# Patient Record
Sex: Male | Born: 1995 | Race: White | Hispanic: No | Marital: Single | Smoking: Current every day smoker
Health system: Southern US, Community
[De-identification: ages and names within clinical notes are randomized; demographics above are authoritative.]

## PROBLEM LIST (undated history)

## (undated) DIAGNOSIS — F988 Other specified behavioral and emotional disorders with onset usually occurring in childhood and adolescence: Secondary | ICD-10-CM

## (undated) DIAGNOSIS — J302 Other seasonal allergic rhinitis: Secondary | ICD-10-CM

## (undated) HISTORY — PX: SKIN GRAFT: SHX250

## (undated) HISTORY — DX: Other seasonal allergic rhinitis: J30.2

## (undated) HISTORY — DX: Other specified behavioral and emotional disorders with onset usually occurring in childhood and adolescence: F98.8

---
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
## 2018-11-27 DIAGNOSIS — F988 Other specified behavioral and emotional disorders with onset usually occurring in childhood and adolescence: Secondary | ICD-10-CM | POA: Insufficient documentation

## 2018-11-27 DIAGNOSIS — B001 Herpesviral vesicular dermatitis: Secondary | ICD-10-CM | POA: Insufficient documentation

## 2018-11-28 ENCOUNTER — Other Ambulatory Visit (HOSPITAL_COMMUNITY): Payer: Self-pay | Admitting: Otolaryngology

## 2018-11-28 ENCOUNTER — Other Ambulatory Visit: Payer: Self-pay | Admitting: Otolaryngology

## 2018-11-28 DIAGNOSIS — R221 Localized swelling, mass and lump, neck: Secondary | ICD-10-CM

## 2018-12-07 ENCOUNTER — Other Ambulatory Visit: Payer: Self-pay

## 2018-12-07 ENCOUNTER — Ambulatory Visit
Admission: RE | Admit: 2018-12-07 | Discharge: 2018-12-07 | Disposition: A | Discharge status: Home / Self Care | Payer: BC Managed Care – PPO | Source: Ambulatory Visit | Attending: Otolaryngology | Admitting: Otolaryngology

## 2018-12-07 DIAGNOSIS — R221 Localized swelling, mass and lump, neck: Secondary | ICD-10-CM | POA: Insufficient documentation

## 2018-12-07 MED ORDER — IOHEXOL 300 MG/ML  SOLN
75.0000 mL | Freq: Once | INTRAMUSCULAR | Status: AC | PRN
  Administered 2018-12-07: 75 mL via INTRAVENOUS

## 2018-12-12 ENCOUNTER — Other Ambulatory Visit: Payer: Self-pay | Admitting: Otolaryngology

## 2018-12-12 DIAGNOSIS — R221 Localized swelling, mass and lump, neck: Secondary | ICD-10-CM

## 2018-12-18 ENCOUNTER — Other Ambulatory Visit: Payer: Self-pay | Admitting: Student

## 2018-12-19 ENCOUNTER — Other Ambulatory Visit: Payer: Self-pay

## 2018-12-19 ENCOUNTER — Ambulatory Visit
Admission: RE | Admit: 2018-12-19 | Discharge: 2018-12-19 | Disposition: A | Discharge status: Home / Self Care | Payer: BC Managed Care – PPO | Source: Ambulatory Visit | Attending: Otolaryngology | Admitting: Otolaryngology

## 2018-12-19 DIAGNOSIS — R221 Localized swelling, mass and lump, neck: Secondary | ICD-10-CM

## 2018-12-19 LAB — CBC
HCT: 47.9 % (ref 39.0–52.0)
Hemoglobin: 17 g/dL (ref 13.0–17.0)
MCH: 32.1 pg (ref 26.0–34.0)
MCHC: 35.5 g/dL (ref 30.0–36.0)
MCV: 90.4 fL (ref 80.0–100.0)
Platelets: 284 10*3/uL (ref 150–400)
RBC: 5.3 MIL/uL (ref 4.22–5.81)
RDW: 12.1 % (ref 11.5–15.5)
WBC: 6.2 10*3/uL (ref 4.0–10.5)
nRBC: 0 % (ref 0.0–0.2)

## 2018-12-19 LAB — PROTIME-INR
INR: 0.9 (ref 0.8–1.2)
Prothrombin Time: 12.3 seconds (ref 11.4–15.2)

## 2018-12-19 MED ORDER — SODIUM CHLORIDE 0.9 % IV SOLN: INTRAVENOUS | Status: DC

## 2018-12-19 NOTE — Discharge Instructions (Signed)
Needle Biopsy, Care After °These instructions tell you how to care for yourself after your procedure. Your doctor may also give you more specific instructions. Call your doctor if you have any problems or questions. °What can I expect after the procedure? °After the procedure, it is common to have: °· Soreness. °· Bruising. °· Mild pain. °Follow these instructions at home: ° °· Return to your normal activities as told by your doctor. Ask your doctor what activities are safe for you. °· Take over-the-counter and prescription medicines only as told by your doctor. °· Wash your hands with soap and water before you change your bandage (dressing). If you cannot use soap and water, use hand sanitizer. °· Follow instructions from your doctor about: °? How to take care of your puncture site. °? When and how to change your bandage. °? When to remove your bandage. °· Check your puncture site every day for signs of infection. Watch for: °? Redness, swelling, or pain. °? Fluid or blood.  °? Pus or a bad smell. °? Warmth. °· Do not take baths, swim, or use a hot tub until your doctor approves. Ask your doctor if you may take showers. You may only be allowed to take sponge baths. °· Keep all follow-up visits as told by your doctor. This is important. °Contact a doctor if you have: °· A fever. °· Redness, swelling, or pain at the puncture site, and it lasts longer than a few days. °· Fluid, blood, or pus coming from the puncture site. °· Warmth coming from the puncture site. °Get help right away if: °· You have a lot of bleeding from the puncture site. °Summary °· After the procedure, it is common to have soreness, bruising, or mild pain at the puncture site. °· Check your puncture site every day for signs of infection, such as redness, swelling, or pain. °· Get help right away if you have severe bleeding from your puncture site. °This information is not intended to replace advice given to you by your health care provider. Make  sure you discuss any questions you have with your health care provider. °Document Released: 01/29/2008 Document Revised: 02/28/2017 Document Reviewed: 02/28/2017 °Elsevier Patient Education © 2020 Elsevier Inc. ° °

## 2018-12-19 NOTE — Progress Notes (Signed)
Patient post LN biopsy (cervical), tolerated well, no sedation per DR Register. bandand dry/intact to left neck. Taking po;s without difficulty. Denies complaints at this time. Discharge instructions given with questions answered.

## 2018-12-19 NOTE — Progress Notes (Signed)
Pt stable after bx.Vss.Neck stable.D/c instructions given.F/u with his MD.

## 2018-12-21 ENCOUNTER — Encounter: Payer: Self-pay | Admitting: Diagnostic Radiology

## 2018-12-25 ENCOUNTER — Other Ambulatory Visit: Payer: Self-pay | Admitting: Anatomic Pathology & Clinical Pathology

## 2018-12-25 LAB — SURGICAL PATHOLOGY

## 2018-12-28 ENCOUNTER — Other Ambulatory Visit: Payer: BC Managed Care – PPO

## 2018-12-28 NOTE — Progress Notes (Signed)
Tumor Board Documentation  Gabryel Goetzinger was presented by Dr Grayland Ormond at our Tumor Board on 12/28/2018, which included representatives from medical oncology, radiation oncology, pathology, radiology, navigation, internal medicine, pharmacy, genetics, palliative care, research.  Raunak currently presents as a new patient, for new positive pathology, for [place] with history of the following treatments: surgical intervention(s), active survellience.  Additionally, we reviewed previous medical and familial history, history of present illness, and recent lab results along with all available histopathologic and imaging studies. The tumor board considered available treatment options and made the following recommendations: Additional screening PET, Bine Marrow biopsy, Seeing Medical Oncology tomorrow  The following procedures/referrals were also placed: No orders of the defined types were placed in this encounter.   Clinical Trial Status: not discussed   Staging used: To be determined  AJCC Staging:       Group: Hodgkins Lymphoma   National site-specific guidelines   were discussed with respect to the case.  Tumor board is a meeting of clinicians from various specialty areas who evaluate and discuss patients for whom a multidisciplinary approach is being considered. Final determinations in the plan of care are those of the provider(s). The responsibility for follow up of recommendations given during tumor board is that of the provider.   Today's extended care, comprehensive team conference, [place] was not present for the discussion and was not examined.   Multidisciplinary Tumor Board is a multidisciplinary case peer review process.  Decisions discussed in the Multidisciplinary Tumor Board reflect the opinions of the specialists present at the conference without having examined the patient.  Ultimately, treatment and diagnostic decisions rest with the primary provider(s) and the patient.

## 2018-12-29 ENCOUNTER — Inpatient Hospital Stay: Payer: BC Managed Care – PPO | Attending: Oncology | Admitting: Oncology

## 2018-12-29 ENCOUNTER — Encounter: Payer: Self-pay | Admitting: Oncology

## 2018-12-29 ENCOUNTER — Other Ambulatory Visit: Payer: Self-pay

## 2018-12-29 DIAGNOSIS — R221 Localized swelling, mass and lump, neck: Secondary | ICD-10-CM

## 2018-12-29 DIAGNOSIS — C8191 Hodgkin lymphoma, unspecified, lymph nodes of head, face, and neck: Secondary | ICD-10-CM | POA: Diagnosis not present

## 2018-12-29 NOTE — Progress Notes (Signed)
Patient prescreened for appointment. Patient has no concerns or questions.  

## 2018-12-31 NOTE — Progress Notes (Signed)
[facility]  [phone] Fax:(336) [phone]  ID: Dale Hansen OB: 1995-04-28  MR#: [id number]  OPF#:[id number]  Patient Care Team: Patient, No Pcp Per as PCP - General (General Practice)  I connected with Dale Hansen on 12/31/18 at  3:00 PM EDT by video enabled telemedicine visit and verified that I am speaking with the correct person using two identifiers.   I discussed the limitations, risks, security and privacy concerns of performing an evaluation and management service by telemedicine and the availability of in-person appointments. I also discussed with the patient that there may be a patient responsible charge related to this service. The patient expressed understanding and agreed to proceed.   Other persons participating in the visit and their role in the encounter: Patient, patient's mother and father, MD  Patients location: Home Providers location: Clinic  CHIEF COMPLAINT: Hodgkin's lymphoma  INTERVAL HISTORY: Patient agreed to video enabled telemedicine visit for initial evaluation since he is currently under quarantine for having direct contact with another student who was Covid positive.  He himself has not been tested, but has been asymptomatic.  He is a 23 year old male who noted a painless swelling in his neck.  Subsequent imaging and biopsy revealed Hodgkin's lymphoma.  He otherwise feels well.  He has no neurologic complaints.  He denies any recent fevers or illnesses.  He denies night sweats or unintentional weight loss.  He has no chest pain, shortness of breath, cough, or hemoptysis.  He denies any nausea, vomiting, constipation, or diarrhea.  He has no urinary complaints.  Patient feels at his baseline offers no specific complaints today.  REVIEW OF SYSTEMS:   Review of Systems  Constitutional: Negative.  Negative for fever, malaise/fatigue and weight loss.  Respiratory: Negative.  Negative for cough, hemoptysis and shortness of breath.     Cardiovascular: Negative.  Negative for chest pain and leg swelling.  Gastrointestinal: Negative.  Negative for abdominal pain.  Genitourinary: Negative.   Musculoskeletal: Negative.  Negative for neck pain.  Skin: Negative.  Negative for rash.  Neurological: Negative.  Negative for dizziness, focal weakness, weakness and headaches.  Psychiatric/Behavioral: Negative.  The patient is not nervous/anxious and does not have insomnia.     As per HPI. Otherwise, a complete review of systems is negative.  PAST MEDICAL HISTORY: Past Medical History:  Diagnosis Date   Attention deficit disorder (ADD)    Seasonal allergies     PAST SURGICAL HISTORY: Negative.  FAMILY HISTORY: Family History  Problem Relation Age of Onset   Cervical cancer Maternal Grandmother    Non-Hodgkin's lymphoma Paternal Uncle     ADVANCED DIRECTIVES (Y/N):  N  HEALTH MAINTENANCE: Social History   Tobacco Use   Smoking status: Not on file  Substance Use Topics   Alcohol use: Yes    Comment: socially   Drug use: Not on file     Colonoscopy:  PAP:  Bone density:  Lipid panel:  No Known Allergies  Current Outpatient Medications  Medication Sig Dispense Refill   amphetamine-dextroamphetamine (ADDERALL) 20 MG tablet Take by mouth.     VYVANSE 60 MG capsule TK 1 C PO QD     No current facility-administered medications for this visit.     OBJECTIVE: There were no vitals filed for this visit.   There is no height or weight on file to calculate BMI.    ECOG FS:0 - Asymptomatic  General: Well-developed, well-nourished, no acute distress. HEENT: Normocephalic. Neuro: Alert, answering all questions appropriately. Cranial  nerves grossly intact. Psych: Normal affect.  LAB RESULTS:  No results found for: NA, K, CL, CO2, GLUCOSE, BUN, CREATININE, CALCIUM, PROT, ALBUMIN, AST, ALT, ALKPHOS, BILITOT, GFRNONAA, GFRAA  Lab Results  Component Value Date   WBC 6.2 12/19/2018   HGB 17.0 12/19/2018    HCT 47.9 12/19/2018   MCV 90.4 12/19/2018   PLT 284 12/19/2018     STUDIES: Ct Soft Tissue Neck W Contrast  Result Date: 12/08/2018 CLINICAL DATA:  Mass of left side of neck. Additional history provided: Patient noticed a left-sided lump at the end of July and states it has grown in size, nonpainful, no pain with swallowing or eating. EXAM: CT NECK WITH CONTRAST TECHNIQUE: Multidetector CT imaging of the neck was performed using the standard protocol following the bolus administration of intravenous contrast. CONTRAST:  29m OMNIPAQUE IOHEXOL 300 MG/ML  SOLN COMPARISON:  No pertinent prior studies available for comparison. FINDINGS: Pharynx and larynx: No appreciable mass or swelling. Salivary glands: No evidence of inflammation, mass or stone. Thyroid: Unremarkable Lymph nodes: There are multiple enlarged cervical chain lymph nodes. An index left level II node/nodal mass measures 6.2 x 3.4 cm in transaxial dimensions (series 6, image 82). Enlarged left level V lymph node measuring up to 1.4 cm in short axis (series 6, image 84). Enlarged left lower III/IV lymph nodes measuring up to 1.7 cm in short axis (series 4, image 80) (series 4, image 77). There are additional left level III lymph nodes which are prominent, but not enlarged by short axis criteria. Mildly enlarged right level IB lymph node measuring 1.1 cm in short axis (series 2, image 55). Mildly enlarged right level II lymph node measuring 1.4 cm in short axis (series 2, image 45). Vascular: The major vascular structures of the neck appear patent. Limited intracranial: Unremarkable Visualized orbits: Unremarkable Mastoids and visualized paranasal sinuses: Mild scattered paranasal sinus mucosal thickening. No significant mastoid effusion. Skeleton: No acute bony abnormality or suspicious osseous lesion. Upper chest: The imaged lung apices are clear These results will be called to the ordering clinician or representative by the Radiologist  Assistant, and communication documented in the PACS or zVision Dashboard. IMPRESSION: Bilateral cervical lymphadenopathy as described. An index left level II node/nodal mass measures 6.2 x 3.4 cm. Findings are suspicious for lymphoma or other lymphoproliferative process. Metastatic nodal disease is also a differential consideration. Consider direct tissue sampling for further evaluation. Electronically Signed   By: KKellie Simmering  On: 12/08/2018 10:43   [place] Biopsy (lymph Nodes)  Result Date: 12/19/2018 INDICATION: Left neck mass. EXAM: ULTRASOUND GUIDED CORE BIOPSY OF LEFT NECK MASS/LYMPH NODE/ MEDICATIONS: None. ANESTHESIA/SEDATION: Local 1% lidocaine. PROCEDURE: The procedure, risks, benefits, and alternatives were explained to the patient. Questions regarding the procedure were encouraged and answered. The patient understands and consents to the procedure. The left neck was prepped with chlorhexidine in a sterile fashion, and a sterile drape was applied covering the operative field. A sterile gown and sterile gloves were used for the procedure. Local anesthesia was provided with 1% Lidocaine. Multiple 18 gauge core samples obtained from the dominant left neck mass. There were no complication. Preliminary cytopathological results indicate good biopsy sample. COMPLICATIONS: None. FINDINGS: Successful ultrasound-directed core biopsy of left neck mass/lymph node. No complications. Discharge instructions given to the patient. Follow-up with patient's physician. IMPRESSION: Successful ultrasound-directed core biopsy of left neck mass/lymph node. Electronically Signed   By: TMarcello Moores Register   On: 12/19/2018 09:57    ASSESSMENT: Hodgkin's lymphoma.  PLAN:    1.  Hodgkin's lymphoma: CT scan on December 08, 2018 reviewed independently and reported as above with 6.2 cm nodal mass in the left neck.  Subsequent biopsy confirmed the diagnosis.  Patient will be finishing school at [employer] and heading home  to [place] on January 22, 2019.  He will be at home through at least early January.  Plan is to do much of the work-up and staging as possible in [place] including PET scan, bone marrow biopsy, MUGA scan, and PFTs.  In the meantime, patient's parents will get him an appointment at [facility] to initiate treatment with ABVD while at home.  Patient will likely have to return to school during his treatments.  At this point, we can continue his regimen in [place] in January 2021 without any delay or missed treatments.  Will have a video assisted telemedicine visit just prior to his departure to home to ensure that all follow-up is established.  I provided 60 minutes of face-to-face video visit time during this encounter, and > 50% was spent counseling as documented under my assessment & plan.   Patient expressed understanding and was in agreement with this plan. He also understands that He can call clinic at any time with any questions, concerns, or complaints.   Cancer Staging No matching staging information was found for the patient.  Lloyd Huger, MD   12/31/2018 7:25 AM

## 2019-01-01 ENCOUNTER — Telehealth: Payer: Self-pay | Admitting: Emergency Medicine

## 2019-01-01 ENCOUNTER — Other Ambulatory Visit: Payer: Self-pay

## 2019-01-01 ENCOUNTER — Telehealth: Payer: Self-pay | Admitting: Oncology

## 2019-01-01 NOTE — Telephone Encounter (Signed)
Called to give pt appt details for this week. Will attempt later.

## 2019-01-01 NOTE — Telephone Encounter (Signed)
Spoke with patient to tell him that he would need to be at PAT COVID testing site between 12:30 and 2:30 on 11/4 and also to let him know that his BM BX has been scheduled for 11/10 and he is to arrive at 7:30. Pt verbalized his understanding, no questions or concerns at this time.

## 2019-01-01 NOTE — Telephone Encounter (Signed)
Called pt to let him know to go for COVID test on 11/4 between 12:30 and 2:30. No answer and voicemail full. Will attempt to call patient again later.

## 2019-01-03 ENCOUNTER — Other Ambulatory Visit
Admission: RE | Admit: 2019-01-03 | Discharge: 2019-01-03 | Disposition: A | Discharge status: Home / Self Care | Payer: BC Managed Care – PPO | Source: Ambulatory Visit | Attending: Oncology | Admitting: Oncology

## 2019-01-03 NOTE — Progress Notes (Signed)
Patient did not show up [facility]. Tried to call the patient and was unable to leave a message (mailbox full).

## 2019-01-04 ENCOUNTER — Ambulatory Visit: Payer: BC Managed Care – PPO

## 2019-01-08 ENCOUNTER — Other Ambulatory Visit: Payer: Self-pay | Admitting: Student

## 2019-01-08 ENCOUNTER — Other Ambulatory Visit: Payer: Self-pay

## 2019-01-08 ENCOUNTER — Ambulatory Visit
Admission: RE | Admit: 2019-01-08 | Discharge: 2019-01-08 | Disposition: A | Discharge status: Home / Self Care | Payer: BC Managed Care – PPO | Source: Ambulatory Visit | Attending: Oncology | Admitting: Oncology

## 2019-01-08 ENCOUNTER — Other Ambulatory Visit
Admission: RE | Admit: 2019-01-08 | Discharge: 2019-01-08 | Disposition: A | Discharge status: Home / Self Care | Payer: BC Managed Care – PPO | Source: Ambulatory Visit | Attending: Oncology | Admitting: Oncology

## 2019-01-08 ENCOUNTER — Other Ambulatory Visit: Payer: Self-pay | Admitting: Radiology

## 2019-01-08 DIAGNOSIS — R221 Localized swelling, mass and lump, neck: Secondary | ICD-10-CM | POA: Insufficient documentation

## 2019-01-08 DIAGNOSIS — Z01812 Encounter for preprocedural laboratory examination: Secondary | ICD-10-CM | POA: Diagnosis present

## 2019-01-08 DIAGNOSIS — U071 COVID-19: Secondary | ICD-10-CM | POA: Insufficient documentation

## 2019-01-08 LAB — GLUCOSE, CAPILLARY: Glucose-Capillary: 85 mg/dL (ref 70–99)

## 2019-01-08 MED ORDER — FLUDEOXYGLUCOSE F - 18 (FDG) INJECTION
10.0000 | Freq: Once | INTRAVENOUS | Status: AC | PRN
  Administered 2019-01-08: 10 via INTRAVENOUS

## 2019-01-09 ENCOUNTER — Ambulatory Visit
Admission: RE | Admit: 2019-01-09 | Discharge: 2019-01-09 | Disposition: A | Discharge status: Home / Self Care | Payer: BC Managed Care – PPO | Source: Ambulatory Visit | Attending: Oncology | Admitting: Oncology

## 2019-01-09 ENCOUNTER — Telehealth: Payer: Self-pay | Admitting: *Deleted

## 2019-01-09 LAB — SARS CORONAVIRUS 2 (TAT 6-24 HRS): SARS Coronavirus 2: POSITIVE — AB

## 2019-01-09 NOTE — OR Nursing (Signed)
Pt tested positive for Covid yesterday 01/08/2019. This was his first Covid positive test. He reports he was exposed three weeks ago. He has been asymptomatic. He has not been tested prior to yesterday. Per hospital policy pt procedure can be scheduled 11 days after positive Covid test.

## 2019-01-13 DIAGNOSIS — C8191 Hodgkin lymphoma, unspecified, lymph nodes of head, face, and neck: Secondary | ICD-10-CM | POA: Insufficient documentation

## 2019-01-13 DIAGNOSIS — C8190 Hodgkin lymphoma, unspecified, unspecified site: Secondary | ICD-10-CM | POA: Insufficient documentation

## 2019-01-13 NOTE — Progress Notes (Signed)
[facility]  Telephone:(336) [phone] Fax:(336) [phone]  ID: Dale Hansen OB: 1995/04/23  MR#: [id number]  AYT#:[id number]  Patient Care Team: Patient, No Pcp Per as PCP - General (General Practice)  I connected with Dale Hansen on 01/19/19 at  3:00 PM EST by video enabled telemedicine visit and verified that I am speaking with the correct person using two identifiers.   I discussed the limitations, risks, security and privacy concerns of performing an evaluation and management service by telemedicine and the availability of in-person appointments. I also discussed with the patient that there may be a patient responsible charge related to this service. The patient expressed understanding and agreed to proceed.   Other persons participating in the visit and their role in the encounter: Patient, patient's parents, MD.  Patients location: Home Providers location: Clinic  CHIEF COMPLAINT: Stage IIa Hodgkin's lymphoma  INTERVAL HISTORY: Patient is still under quarantine for recently testing positive for Covid, therefore agreed to video enabled telemedicine visit.  He continues to feel well and remains asymptomatic.  Pain the swelling in his neck is unchanged. He has no neurologic complaints.  He denies any recent fevers or illnesses.  He denies night sweats or unintentional weight loss.  He has no chest pain, shortness of breath, cough, or hemoptysis.  He denies any nausea, vomiting, constipation, or diarrhea.  He has no urinary complaints.  Patient offers no specific complaints today.  REVIEW OF SYSTEMS:   Review of Systems  Constitutional: Negative.  Negative for fever, malaise/fatigue and weight loss.  Respiratory: Negative.  Negative for cough, hemoptysis and shortness of breath.   Cardiovascular: Negative.  Negative for chest pain and leg swelling.  Gastrointestinal: Negative.  Negative for abdominal pain.  Genitourinary: Negative.   Musculoskeletal: Negative.   Negative for neck pain.  Skin: Negative.  Negative for rash.  Neurological: Negative.  Negative for dizziness, focal weakness, weakness and headaches.  Psychiatric/Behavioral: Negative.  The patient is not nervous/anxious and does not have insomnia.     As per HPI. Otherwise, a complete review of systems is negative.  PAST MEDICAL HISTORY: Past Medical History:  Diagnosis Date   Attention deficit disorder (ADD)    Seasonal allergies     PAST SURGICAL HISTORY: Negative.  FAMILY HISTORY: Family History  Problem Relation Age of Onset   Cervical cancer Maternal Grandmother    Non-Hodgkin's lymphoma Paternal Uncle     ADVANCED DIRECTIVES (Y/N):  N  HEALTH MAINTENANCE: Social History   Tobacco Use   Smoking status: Current Every Day Smoker    Packs/day: 0.25   Smokeless tobacco: Never Used  Substance Use Topics   Alcohol use: Yes    Comment: socially   Drug use: Never     Colonoscopy:  PAP:  Bone density:  Lipid panel:  No Known Allergies  Current Outpatient Medications  Medication Sig Dispense Refill   amphetamine-dextroamphetamine (ADDERALL) 20 MG tablet Take by mouth.     VYVANSE 60 MG capsule TK 1 C PO QD     No current facility-administered medications for this visit.    Facility-Administered Medications Ordered in Other Visits  Medication Dose Route Frequency Provider Last Rate Last Dose   0.9 %  sodium chloride infusion   Intravenous Continuous Monia Sabal, PA-C        OBJECTIVE: There were no vitals filed for this visit.   There is no height or weight on file to calculate BMI.    ECOG FS:0 - Asymptomatic  General: Well-developed, well-nourished,  no acute distress. HEENT: Normocephalic. Neuro: Alert, answering all questions appropriately. Cranial nerves grossly intact. Psych: Normal affect.  LAB RESULTS:  No results found for: NA, K, CL, CO2, GLUCOSE, BUN, CREATININE, CALCIUM, PROT, ALBUMIN, AST, ALT, ALKPHOS, BILITOT, GFRNONAA,  GFRAA  Lab Results  Component Value Date   WBC 5.5 01/19/2019   NEUTROABS 2.5 01/19/2019   HGB 16.2 01/19/2019   HCT 44.8 01/19/2019   MCV 90.0 01/19/2019   PLT 275 01/19/2019     STUDIES: Nm Pet Image Initial (pi) Skull Base To Thigh  Result Date: 01/08/2019 CLINICAL DATA:  Initial treatment strategy for Hodgkin's lymphoma. EXAM: NUCLEAR MEDICINE PET SKULL BASE TO THIGH TECHNIQUE: 10.0 mCi F-18 FDG was injected intravenously. Full-ring PET imaging was performed from the skull base to thigh after the radiotracer. CT data was obtained and used for attenuation correction and anatomic localization. Fasting blood glucose: 85 mg/dl COMPARISON:  CT neck 12/07/2018 FINDINGS: Mediastinal blood pool activity: SUV max 2.2 Liver activity: SUV max 3.0 NECK: Hypermetabolic activity the palatine tonsils, maximum SUV 9.2 on the left 11.9 on the right. Hypermetabolic bilateral level II adenopathy with left level III and level IV adenopathy noted. Index level IIa lymph node measuring 3.4 cm in short axis on image 61/3, maximum SUV 24.3. Left level IIb lymph node 1.1 cm in short axis on image 58/3, maximum SUV 10.9. Right level IIa lymph node 1.3 cm in short axis on image 57/3, maximum SUV 6.7. Left level IV lymph node 1.1 cm in short axis on image 83/3, maximum SUV 9.9. All of the lymph nodes in the neck are Deauville 5. Incidental CT findings: none CHEST: Accentuated distal esophageal activity, maximum SUV 4.6, probably physiologic but technically nonspecific. Incidental CT findings: 3 by 4 mm right upper lobe nodule on image 113/3, stable, highly likely to be benign and incidental based on patient age. ABDOMEN/PELVIS: Accentuated activity in a loop of right lower quadrant bowel is most likely physiologic, maximum SUV 9.1, no compelling CT abnormality. No splenomegaly or abnormal accentuated splenic activity. Right external iliac node 0.9 cm in short axis on image 288/3, maximum SUV 2.1, Deauville 2. Incidental CT  findings: none SKELETON: No significant abnormal hypermetabolic activity in this region. Incidental CT findings: none IMPRESSION: 1. Bilateral Deauville 5 adenopathy in the neck. Accentuated metabolic activity in both palatine tonsils. 2. Mildly accentuated distal esophageal activity is probably physiologic. 3. Low-grade activity in upper normal sized right external iliac lymph node, Deauville 2. Electronically Signed   By: Van Clines M.D.   On: 01/08/2019 14:57    ASSESSMENT: Stage IIa Hodgkin's lymphoma.  PLAN:    1.  Hodgkin's lymphoma: CT scan on December 08, 2018 reviewed independently and reported as above with 6.2 cm nodal mass in the left neck.  Subsequent biopsy confirmed the diagnosis.  PET scan results from January 08, 2019 reviewed independently and reported as above with bilateral neck lymphadenopathy and no other obvious evidence of disease confirming stage.  Patient has a bone marrow biopsy scheduled for Friday, January 19, 2019 to complete the staging work-up.  He is moving back to [place] next Tuesday and already has scheduled follow-up at [facility].  He will require to complete his MUGA as well as PFTs in [place].  He will also require port placement prior to initiating treatment.  He states he is returning to school in mid January, therefore will likely receive 2 or 3 treatments of ABVD prior to returning to [place].  Plan is to continue  treatments when he returns without significant delay.  Patient will need a PET scan after 2 cycles of or 4 treatments of ABVD for reevaluation.  No follow-up has been scheduled at this time.  Patient will return to clinic to continue treatment when he is back in [place].   2.  Covid positive: Patient has now been under quarantine for 10 days and remains asymptomatic.  I provided 25 minutes of face-to-face video visit time during this encounter, and > 50% was spent counseling as documented under my assessment & plan.    Patient  expressed understanding and was in agreement with this plan. He also understands that He can call clinic at any time with any questions, concerns, or complaints.   Cancer Staging Hodgkin lymphoma [facility]) Staging form: Hodgkin and Non-Hodgkin Lymphoma, AJCC 8th Edition - Clinical stage from 01/19/2019: Stage II (Hodgkin lymphoma, A - Asymptomatic) - Signed by Lloyd Huger, MD on 01/19/2019   Lloyd Huger, MD   01/19/2019 9:37 AM

## 2019-01-17 ENCOUNTER — Other Ambulatory Visit: Payer: Self-pay | Admitting: Radiology

## 2019-01-18 ENCOUNTER — Inpatient Hospital Stay: Payer: BC Managed Care – PPO | Attending: Oncology | Admitting: Oncology

## 2019-01-18 ENCOUNTER — Encounter: Payer: Self-pay | Admitting: Oncology

## 2019-01-18 DIAGNOSIS — C8191 Hodgkin lymphoma, unspecified, lymph nodes of head, face, and neck: Secondary | ICD-10-CM | POA: Diagnosis not present

## 2019-01-18 DIAGNOSIS — C8198 Hodgkin lymphoma, unspecified, lymph nodes of multiple sites: Secondary | ICD-10-CM

## 2019-01-18 NOTE — Progress Notes (Signed)
Patient prescreened for appointment. Patient has no concerns or questions.  

## 2019-01-19 ENCOUNTER — Ambulatory Visit
Admission: RE | Admit: 2019-01-19 | Discharge: 2019-01-19 | Disposition: A | Discharge status: Home / Self Care | Payer: BC Managed Care – PPO | Source: Ambulatory Visit | Attending: Oncology | Admitting: Oncology

## 2019-01-19 ENCOUNTER — Other Ambulatory Visit: Payer: Self-pay

## 2019-01-19 DIAGNOSIS — F17210 Nicotine dependence, cigarettes, uncomplicated: Secondary | ICD-10-CM | POA: Insufficient documentation

## 2019-01-19 DIAGNOSIS — Z79899 Other long term (current) drug therapy: Secondary | ICD-10-CM | POA: Diagnosis not present

## 2019-01-19 DIAGNOSIS — C8191 Hodgkin lymphoma, unspecified, lymph nodes of head, face, and neck: Secondary | ICD-10-CM | POA: Diagnosis not present

## 2019-01-19 DIAGNOSIS — R221 Localized swelling, mass and lump, neck: Secondary | ICD-10-CM

## 2019-01-19 LAB — CBC WITH DIFFERENTIAL/PLATELET
Abs Immature Granulocytes: 0.03 10*3/uL (ref 0.00–0.07)
Basophils Absolute: 0 10*3/uL (ref 0.0–0.1)
Basophils Relative: 1 %
Eosinophils Absolute: 0.3 10*3/uL (ref 0.0–0.5)
Eosinophils Relative: 5 %
HCT: 44.8 % (ref 39.0–52.0)
Hemoglobin: 16.2 g/dL (ref 13.0–17.0)
Immature Granulocytes: 1 %
Lymphocytes Relative: 33 %
Lymphs Abs: 1.8 10*3/uL (ref 0.7–4.0)
MCH: 32.5 pg (ref 26.0–34.0)
MCHC: 36.2 g/dL — ABNORMAL HIGH (ref 30.0–36.0)
MCV: 90 fL (ref 80.0–100.0)
Monocytes Absolute: 0.9 10*3/uL (ref 0.1–1.0)
Monocytes Relative: 16 %
Neutro Abs: 2.5 10*3/uL (ref 1.7–7.7)
Neutrophils Relative %: 44 %
Platelets: 275 10*3/uL (ref 150–400)
RBC: 4.98 MIL/uL (ref 4.22–5.81)
RDW: 12 % (ref 11.5–15.5)
WBC: 5.5 10*3/uL (ref 4.0–10.5)
nRBC: 0 % (ref 0.0–0.2)

## 2019-01-19 LAB — PROTIME-INR
INR: 1 (ref 0.8–1.2)
Prothrombin Time: 13.3 seconds (ref 11.4–15.2)

## 2019-01-19 MED ORDER — FENTANYL CITRATE (PF) 100 MCG/2ML IJ SOLN
INTRAMUSCULAR | Status: AC
  Filled 2019-01-19: qty 2

## 2019-01-19 MED ORDER — MIDAZOLAM HCL 2 MG/2ML IJ SOLN
INTRAMUSCULAR | Status: AC
  Filled 2019-01-19: qty 4

## 2019-01-19 MED ORDER — FENTANYL CITRATE (PF) 100 MCG/2ML IJ SOLN
INTRAMUSCULAR | Status: AC | PRN
  Administered 2019-01-19 (×2): 50 ug via INTRAVENOUS

## 2019-01-19 MED ORDER — MIDAZOLAM HCL 2 MG/2ML IJ SOLN
INTRAMUSCULAR | Status: AC | PRN
  Administered 2019-01-19 (×3): 1 mg via INTRAVENOUS

## 2019-01-19 MED ORDER — HEPARIN SOD (PORK) LOCK FLUSH 100 UNIT/ML IV SOLN
INTRAVENOUS | Status: AC
  Filled 2019-01-19: qty 5

## 2019-01-19 MED ORDER — SODIUM CHLORIDE 0.9 % IV SOLN
INTRAVENOUS | Status: DC
  Administered 2019-01-19: 10:00:00 via INTRAVENOUS

## 2019-01-19 NOTE — Discharge Instructions (Signed)
Moderate Conscious Sedation, Adult, Care After °These instructions provide you with information about caring for yourself after your procedure. Your health care provider may also give you more specific instructions. Your treatment has been planned according to current medical practices, but problems sometimes occur. Call your health care provider if you have any problems or questions after your procedure. °What can I expect after the procedure? °After your procedure, it is common: °· To feel sleepy for several hours. °· To feel clumsy and have poor balance for several hours. °· To have poor judgment for several hours. °· To vomit if you eat too soon. °Follow these instructions at home: °For at least 24 hours after the procedure: ° °· Do not: °? Participate in activities where you could fall or become injured. °? Drive. °? Use heavy machinery. °? Drink alcohol. °? Take sleeping pills or medicines that cause drowsiness. °? Make important decisions or sign legal documents. °? Take care of children on your own. °· Rest. °Eating and drinking °· Follow the diet recommended by your health care provider. °· If you vomit: °? Drink water, juice, or soup when you can drink without vomiting. °? Make sure you have little or no nausea before eating solid foods. °General instructions °· Have a responsible adult stay with you until you are awake and alert. °· Take over-the-counter and prescription medicines only as told by your health care provider. °· If you smoke, do not smoke without supervision. °· Keep all follow-up visits as told by your health care provider. This is important. °Contact a health care provider if: °· You keep feeling nauseous or you keep vomiting. °· You feel light-headed. °· You develop a rash. °· You have a fever. °Get help right away if: °· You have trouble breathing. °This information is not intended to replace advice given to you by your health care provider. Make sure you discuss any questions you have  with your health care provider. °Document Released: 12/06/2012 Document Revised: 01/28/2017 Document Reviewed: 06/07/2015 °Elsevier Patient Education © 2020 [employer] °Needle Biopsy, Care After °These instructions tell you how to care for yourself after your procedure. Your doctor may also give you more specific instructions. Call your doctor if you have any problems or questions. °What can I expect after the procedure? °After the procedure, it is common to have: °· Soreness. °· Bruising. °· Mild pain. °Follow these instructions at home: ° °· Return to your normal activities as told by your doctor. Ask your doctor what activities are safe for you. °· Take over-the-counter and prescription medicines only as told by your doctor. °· Wash your hands with soap and water before you change your bandage (dressing). If you cannot use soap and water, use hand sanitizer. °· Follow instructions from your doctor about: °? How to take care of your puncture site. °? When and how to change your bandage. °? When to remove your bandage. °· Check your puncture site every day for signs of infection. Watch for: °? Redness, swelling, or pain. °? Fluid or blood.  °? Pus or a bad smell. °? Warmth. °· Do not take baths, swim, or use a hot tub until your doctor approves. Ask your doctor if you may take showers. You may only be allowed to take sponge baths. °· Keep all follow-up visits as told by your doctor. This is important. °Contact a doctor if you have: °· A fever. °· Redness, swelling, or pain at the puncture site, and it lasts longer than a few   days. °· Fluid, blood, or pus coming from the puncture site. °· Warmth coming from the puncture site. °Get help right away if: °· You have a lot of bleeding from the puncture site. °Summary °· After the procedure, it is common to have soreness, bruising, or mild pain at the puncture site. °· Check your puncture site every day for signs of infection, such as redness, swelling, or pain. °· Get  help right away if you have severe bleeding from your puncture site. °This information is not intended to replace advice given to you by your health care provider. Make sure you discuss any questions you have with your health care provider. °Document Released: 01/29/2008 Document Revised: 02/28/2017 Document Reviewed: 02/28/2017 °Elsevier Patient Education © 2020 [employer] ° °

## 2019-01-19 NOTE — Procedures (Signed)
Interventional Radiology Procedure Note  Procedure: CT guided bone marrow aspiration and biopsy  Complications: None  EBL: < 10 mL  Findings: Aspirate and core biopsy performed of bone marrow in right iliac bone.  Plan: Bedrest supine x 1 hrs  Lianni Kanaan T. San Lohmeyer, M.D Pager:  [phone]   

## 2019-01-19 NOTE — H&P (Signed)
Chief Complaint: Patient was seen in consultation today for bone marrow biopsy at the request of Finnegan,Timothy J  Referring Physician(s): Finnegan,Timothy J  Patient Status: [facility] - Out-pt  History of Present Illness: Dale Hansen is a 23 y.o. male with Hodgkin's lymphoma of cervical lymph nodes, status post biopsy of left cervical lymphadenopathy. Now here for bone marrow biopsy for staging purposes.  Past Medical History:  Diagnosis Date   Attention deficit disorder (ADD)    Seasonal allergies     Past Surgical History:  Procedure Laterality Date   SKIN GRAFT Right    right middle finger    Allergies: Patient has no known allergies.  Medications: Prior to Admission medications   Medication Sig Start Date End Date Taking? Authorizing Provider  amphetamine-dextroamphetamine (ADDERALL) 20 MG tablet Take by mouth. 10/07/16  Yes [provider]  VYVANSE 60 MG capsule TK 1 C PO QD 12/06/18  Yes [provider]     Family History  Problem Relation Age of Onset   Cervical cancer Maternal Grandmother    Non-Hodgkin's lymphoma Paternal Uncle     Social History   Socioeconomic History   Marital status: Single    Spouse name: Not on file   Number of children: Not on file   Years of education: Not on file   Highest education level: Not on file  Occupational History   Not on file  Social Needs   Financial resource strain: Not on file   Food insecurity    Worry: Not on file    Inability: Not on file   Transportation needs    Medical: Not on file    Non-medical: Not on file  Tobacco Use   Smoking status: Current Every Day Smoker    Packs/day: 0.25   Smokeless tobacco: Never Used  Substance and Sexual Activity   Alcohol use: Yes    Comment: socially   Drug use: Never   Sexual activity: Not on file  Lifestyle   Physical activity    Days per week: Not on file    Minutes per session: Not on file   Stress: Not on file    Relationships   Social connections    Talks on phone: Not on file    Gets together: Not on file    Attends religious service: Not on file    Active member of club or organization: Not on file    Attends meetings of clubs or organizations: Not on file    Relationship status: Not on file  Other Topics Concern   Not on file  Social History Narrative   Not on file    ECOG Status: 0 - Asymptomatic  Review of Systems: A 12 point ROS discussed and pertinent positives are indicated in the HPI above.  All other systems are negative.  Review of Systems  Constitutional: Negative.   HENT: Negative.   Respiratory: Negative.   Cardiovascular: Negative.   Gastrointestinal: Negative.   Genitourinary: Negative.   Musculoskeletal: Negative.   Neurological: Negative.     Vital Signs: There were no vitals taken for this visit.  Physical Exam Constitutional:      General: He is not in acute distress.    Appearance: He is normal weight. He is not ill-appearing or toxic-appearing.  HENT:     Head: Normocephalic and atraumatic.  Neck:     Comments: Palpable lymph node mass of left neck. Cardiovascular:     Rate and Rhythm: Normal rate and regular rhythm.  Heart sounds: Normal heart sounds. No murmur. No gallop.   Pulmonary:     Effort: Pulmonary effort is normal. No respiratory distress.     Breath sounds: Normal breath sounds. No stridor. No wheezing, rhonchi or rales.  Abdominal:     General: Abdomen is flat. There is no distension.     Palpations: Abdomen is soft. There is no mass.     Tenderness: There is no abdominal tenderness. There is no right CVA tenderness, guarding or rebound.  Musculoskeletal:        General: No swelling.  Lymphadenopathy:     Cervical: Cervical adenopathy present.  Skin:    General: Skin is warm and dry.  Neurological:     General: No focal deficit present.     Mental Status: He is alert and oriented to person, place, and time.      Imaging: Nm Pet Image Initial (pi) Skull Base To Thigh  Result Date: 01/08/2019 CLINICAL DATA:  Initial treatment strategy for Hodgkin's lymphoma. EXAM: NUCLEAR MEDICINE PET SKULL BASE TO THIGH TECHNIQUE: 10.0 mCi F-18 FDG was injected intravenously. Full-ring PET imaging was performed from the skull base to thigh after the radiotracer. CT data was obtained and used for attenuation correction and anatomic localization. Fasting blood glucose: 85 mg/dl COMPARISON:  CT neck 12/07/2018 FINDINGS: Mediastinal blood pool activity: SUV max 2.2 Liver activity: SUV max 3.0 NECK: Hypermetabolic activity the palatine tonsils, maximum SUV 9.2 on the left 11.9 on the right. Hypermetabolic bilateral level II adenopathy with left level III and level IV adenopathy noted. Index level IIa lymph node measuring 3.4 cm in short axis on image 61/3, maximum SUV 24.3. Left level IIb lymph node 1.1 cm in short axis on image 58/3, maximum SUV 10.9. Right level IIa lymph node 1.3 cm in short axis on image 57/3, maximum SUV 6.7. Left level IV lymph node 1.1 cm in short axis on image 83/3, maximum SUV 9.9. All of the lymph nodes in the neck are Deauville 5. Incidental CT findings: none CHEST: Accentuated distal esophageal activity, maximum SUV 4.6, probably physiologic but technically nonspecific. Incidental CT findings: 3 by 4 mm right upper lobe nodule on image 113/3, stable, highly likely to be benign and incidental based on patient age. ABDOMEN/PELVIS: Accentuated activity in a loop of right lower quadrant bowel is most likely physiologic, maximum SUV 9.1, no compelling CT abnormality. No splenomegaly or abnormal accentuated splenic activity. Right external iliac node 0.9 cm in short axis on image 288/3, maximum SUV 2.1, Deauville 2. Incidental CT findings: none SKELETON: No significant abnormal hypermetabolic activity in this region. Incidental CT findings: none IMPRESSION: 1. Bilateral Deauville 5 adenopathy in the neck.  Accentuated metabolic activity in both palatine tonsils. 2. Mildly accentuated distal esophageal activity is probably physiologic. 3. Low-grade activity in upper normal sized right external iliac lymph node, Deauville 2. Electronically Signed   By: Van Clines M.D.   On: 01/08/2019 14:57    Labs:  CBC: Recent Labs    12/19/18 0819 01/19/19 0842  WBC 6.2 5.5  HGB 17.0 16.2  HCT 47.9 44.8  PLT 284 275    COAGS: Recent Labs    12/19/18 0819 01/19/19 0842  INR 0.9 1.0    BMP: No results for input(s): NA, K, CL, CO2, GLUCOSE, BUN, CALCIUM, CREATININE, GFRNONAA, GFRAA in the last 8760 hours.  Invalid input(s): CMP  LIVER FUNCTION TESTS: No results for input(s): BILITOT, AST, ALT, ALKPHOS, PROT, ALBUMIN in the last 8760 hours.  TUMOR  MARKERS: No results for input(s): AFPTM, CEA, CA199, CHROMGRNA in the last 8760 hours.  Assessment and Plan:  For CT guided bone marrow biopsy today. Risks and benefits of bone marrow biopsy was discussed with the patient and/or patient's family including, but not limited to bleeding, infection, damage to adjacent structures or low yield requiring additional tests.  All of the questions were answered and there is agreement to proceed.  Consent signed and in chart.  Thank you for this interesting consult.  I greatly enjoyed meeting Manas Hickling and look forward to participating in their care.  A copy of this report was sent to the requesting provider on this date.  Electronically Signed: Azzie Roup, MD 01/19/2019, 9:57 AM   I spent a total of 30 Minutes in face to face in clinical consultation, greater than 50% of which was counseling/coordinating care for bone marrow biopsy.

## 2019-01-29 ENCOUNTER — Encounter (HOSPITAL_COMMUNITY): Payer: Self-pay | Admitting: Oncology

## 2019-01-29 LAB — SURGICAL PATHOLOGY

## 2019-03-10 NOTE — Progress Notes (Signed)
[place]  Telephone:(336) [phone] Fax:(336) [phone]  ID: Dale Hansen OB: [birth year]  MR#: [id number]  QTM#:[id number]  Patient Care Team: Patient, No Pcp Per as PCP - General (General Practice)  I connected with Dale Hansen on 03/13/19 at  3:30 PM EST by video enabled telemedicine visit and verified that I am speaking with the correct person using two identifiers.   I discussed the limitations, risks, security and privacy concerns of performing an evaluation and management service by telemedicine and the availability of in-person appointments. I also discussed with the patient that there may be a patient responsible charge related to this service. The patient expressed understanding and agreed to proceed.   Other persons participating in the visit and their role in the encounter: Patient, patient's parents, MD.  Patient's location: Home. Provider's location: Clinic.  CHIEF COMPLAINT: Stage IIa Hodgkin's lymphoma  INTERVAL HISTORY: Patient agreed to video enabled telemedicine visit for further evaluation and continuation of treatment.  He received his first 3 infusions of ABVD in [place] and is now returning to school to complete his treatments.  He tolerated infusions relatively well only with some increased nausea, weakness and fatigue.  Lymphadenopathy his neck is nearly resolved.  He currently feels well.  He has no neurologic complaints.  He denies any recent fevers or illnesses.  He denies night sweats or unintentional weight loss.  He has no chest pain, shortness of breath, cough, or hemoptysis.  He denies any nausea, vomiting, constipation, or diarrhea.  He has no urinary complaints.  Patient offers no specific complaints today.  REVIEW OF SYSTEMS:   Review of Systems  Constitutional: Negative.  Negative for fever, malaise/fatigue and weight loss.  Respiratory: Negative.  Negative for cough, hemoptysis and shortness of breath.   Cardiovascular: Negative.   Negative for chest pain and leg swelling.  Gastrointestinal: Negative.  Negative for abdominal pain.  Genitourinary: Negative.   Musculoskeletal: Negative.  Negative for neck pain.  Skin: Negative.  Negative for rash.  Neurological: Negative.  Negative for dizziness, focal weakness, weakness and headaches.  Psychiatric/Behavioral: Negative.  The patient is not nervous/anxious and does not have insomnia.     As per HPI. Otherwise, a complete review of systems is negative.  PAST MEDICAL HISTORY: Past Medical History:  Diagnosis Date  . Attention deficit disorder (ADD)   . Seasonal allergies     PAST SURGICAL HISTORY: Negative.  FAMILY HISTORY: Family History  Problem Relation Age of Onset  . Cervical cancer Maternal Grandmother   . Non-Hodgkin's lymphoma Paternal Uncle     ADVANCED DIRECTIVES (Y/N):  N  HEALTH MAINTENANCE: Social History   Tobacco Use  . Smoking status: Current Every Day Smoker    Packs/day: 0.25  . Smokeless tobacco: Never Used  Substance Use Topics  . Alcohol use: Yes    Comment: socially  . Drug use: Never     Colonoscopy:  PAP:  Bone density:  Lipid panel:  No Known Allergies  Current Outpatient Medications  Medication Sig Dispense Refill  . amphetamine-dextroamphetamine (ADDERALL) 20 MG tablet Take by mouth.    Marland Kitchen VYVANSE 60 MG capsule TK 1 C PO QD     No current facility-administered medications for this visit.    OBJECTIVE: There were no vitals filed for this visit.   There is no height or weight on file to calculate BMI.    ECOG FS:0 - Asymptomatic  General: Well-developed, well-nourished, no acute distress. HEENT: Normocephalic.  No obvious lymphadenopathy noted. Neuro: Alert,  answering all questions appropriately. Cranial nerves grossly intact. Psych: Normal affect.  LAB RESULTS:  No results found for: NA, K, CL, CO2, GLUCOSE, BUN, CREATININE, CALCIUM, PROT, ALBUMIN, AST, ALT, ALKPHOS, BILITOT, GFRNONAA, GFRAA  Lab Results    Component Value Date   WBC 5.5 01/19/2019   NEUTROABS 2.5 01/19/2019   HGB 16.2 01/19/2019   HCT 44.8 01/19/2019   MCV 90.0 01/19/2019   PLT 275 01/19/2019     STUDIES: No results found.  ASSESSMENT: Stage IIa Hodgkin's lymphoma, EBV positive.  PLAN:    1.  Hodgkin's lymphoma: CT scan on December 08, 2018 revealed a 6.2 cm nodal mass in the left neck.  Subsequent biopsy confirmed the diagnosis.  PET scan results from January 08, 2019 with bilateral neck lymphadenopathy and no other obvious evidence of disease confirming stage.  Bone marrow biopsy on January 19, 2019 revealed normal cellular marrow with trilineage hematopoiesis.  No morphologic evidence of Hodgkin's lymphoma.  PFT and MUGA completed in [place] are reported as adequate to proceed with treatment.  Initiated cycle 1, day 1 of ABVD on February 07, 2019.  Patient will return to clinic on March 21, 2019 to receive cycle 2, day 15.  Plan is to complete cycle 2 and then repeat PET scan.  If results from prior report Deauville 1 or 2 he likely can complete treatment and possibly consider consolidation XRT.  If results report Deauville 3 or higher, will likely consider additional chemotherapy.   2.  Covid positive: Resolved.  I provided 25 minutes of face-to-face video visit time during this encounter which included chart review. Greater than 50% was spent counseling as documented under my assessment & plan.   Patient expressed understanding and was in agreement with this plan. He also understands that He can call clinic at any time with any questions, concerns, or complaints.   Cancer Staging Hodgkin lymphoma [facility]) Staging form: Hodgkin and Non-Hodgkin Lymphoma, AJCC 8th Edition - Clinical stage from 01/19/2019: Stage II (Hodgkin lymphoma, A - Asymptomatic) - Signed by Lloyd Huger, MD on 01/19/2019   Lloyd Huger, MD   03/13/2019 6:59 AM

## 2019-03-12 ENCOUNTER — Inpatient Hospital Stay: Payer: BC Managed Care – PPO | Attending: Oncology | Admitting: Oncology

## 2019-03-12 ENCOUNTER — Other Ambulatory Visit: Payer: Self-pay

## 2019-03-12 DIAGNOSIS — Z7189 Other specified counseling: Secondary | ICD-10-CM

## 2019-03-12 DIAGNOSIS — Z5111 Encounter for antineoplastic chemotherapy: Secondary | ICD-10-CM | POA: Insufficient documentation

## 2019-03-12 DIAGNOSIS — Z8616 Personal history of COVID-19: Secondary | ICD-10-CM | POA: Insufficient documentation

## 2019-03-12 DIAGNOSIS — C8198 Hodgkin lymphoma, unspecified, lymph nodes of multiple sites: Secondary | ICD-10-CM

## 2019-03-12 DIAGNOSIS — F17210 Nicotine dependence, cigarettes, uncomplicated: Secondary | ICD-10-CM | POA: Diagnosis not present

## 2019-03-12 DIAGNOSIS — Z79899 Other long term (current) drug therapy: Secondary | ICD-10-CM | POA: Insufficient documentation

## 2019-03-12 DIAGNOSIS — C8190 Hodgkin lymphoma, unspecified, unspecified site: Secondary | ICD-10-CM | POA: Insufficient documentation

## 2019-03-13 DIAGNOSIS — Z7189 Other specified counseling: Secondary | ICD-10-CM | POA: Insufficient documentation

## 2019-03-13 NOTE — Progress Notes (Signed)
START ON PATHWAY REGIMEN - Lymphoma and CLL     A cycle is every 28 days:     Doxorubicin      Dacarbazine      Vinblastine      Bleomycin   **Always confirm dose/schedule in your pharmacy ordering system**  Patient Characteristics: Classical Hodgkin Lymphoma, First Line, Stage I / II, Early Favorable with  No Risk Factors  and No Bulk, Age < 22 Disease Type: Not Applicable Disease Type: Not Applicable Disease Type: Classical Hodgkin Lymphoma Line of therapy: First Line [place] Stage: IIA First Line, Stage I/II Disease Characteristics: Early Favorable with No Risk Factors and No Bulk Age: < 4 Intent of Therapy: Curative Intent, Discussed with Patient

## 2019-03-16 NOTE — Progress Notes (Signed)
[facility]  Telephone:([phone] [phone]  ID: Dale Hansen OB: [birth year]  MR#: [id number]  HRV#:[id number]  Patient Care Team: Patient, No Pcp Per as PCP - General (General Practice)  CHIEF COMPLAINT: Stage IIa Hodgkin's lymphoma  INTERVAL HISTORY: Patient returns to clinic today for further evaluation and consideration of cycle 2, day 15 of ABVD.  He currently feels well and is asymptomatic.  He admits to occasional insomnia. He does not complain of weakness or fatigue today.  Lymphadenopathy his neck is nearly resolved. He has no neurologic complaints.  He denies any recent fevers or illnesses.  He denies night sweats or unintentional weight loss.  He has no chest pain, shortness of breath, cough, or hemoptysis.  He denies any nausea, vomiting, constipation, or diarrhea.  He has no urinary complaints.  Patient offers no further specific complaints today.  REVIEW OF SYSTEMS:   Review of Systems  Constitutional: Negative.  Negative for fever, malaise/fatigue and weight loss.  Respiratory: Negative.  Negative for cough, hemoptysis and shortness of breath.   Cardiovascular: Negative.  Negative for chest pain and leg swelling.  Gastrointestinal: Negative.  Negative for abdominal pain.  Genitourinary: Negative.   Musculoskeletal: Negative.  Negative for neck pain.  Skin: Negative.  Negative for rash.  Neurological: Negative.  Negative for dizziness, focal weakness, weakness and headaches.  Psychiatric/Behavioral: The patient has insomnia. The patient is not nervous/anxious.     As per HPI. Otherwise, a complete review of systems is negative.  PAST MEDICAL HISTORY: Past Medical History:  Diagnosis Date  . Attention deficit disorder (ADD)   . Seasonal allergies     PAST SURGICAL HISTORY: Negative.  FAMILY HISTORY: Family History  Problem Relation Age of Onset  . Cervical cancer Maternal Grandmother   . Non-Hodgkin's lymphoma Paternal Uncle      ADVANCED DIRECTIVES (Y/N):  N  HEALTH MAINTENANCE: Social History   Tobacco Use  . Smoking status: Current Every Day Smoker    Packs/day: 0.25  . Smokeless tobacco: Never Used  Substance Use Topics  . Alcohol use: Yes    Comment: socially  . Drug use: Never     Colonoscopy:  PAP:  Bone density:  Lipid panel:  No Known Allergies  Current Outpatient Medications  Medication Sig Dispense Refill  . prochlorperazine (COMPAZINE) 10 MG tablet Take by mouth.    Marland Kitchen amphetamine-dextroamphetamine (ADDERALL) 20 MG tablet Take by mouth.    . lidocaine-prilocaine (EMLA) cream     . LORazepam (ATIVAN) 0.5 MG tablet Take 1 tablet (0.5 mg total) by mouth at bedtime. 30 tablet 0  . ondansetron (ZOFRAN) 8 MG tablet Take 1 tablet (8 mg total) by mouth every 8 (eight) hours as needed for nausea or vomiting. 30 tablet 2  . VYVANSE 60 MG capsule TK 1 C PO QD     No current facility-administered medications for this visit.    OBJECTIVE: Vitals:   03/21/19 1026 03/21/19 1041  BP: 135/81   Pulse: (!) 125 (!) 110  Resp: 16   Temp: (!) 96.2 F (35.7 C)   SpO2: 100%      Body mass index is 24.02 kg/m.    ECOG FS:0 - Asymptomatic  General: Well-developed, well-nourished, no acute distress. Eyes: Pink conjunctiva, anicteric sclera. HEENT: Normocephalic, moist mucous membranes.  No palpable lymphadenopathy. Lungs: No audible wheezing or coughing. Heart: Tachycardic. Abdomen: Soft, nontender, no obvious distention. Musculoskeletal: No edema, cyanosis, or clubbing. Neuro: Alert, answering all questions appropriately. Cranial nerves grossly intact.  Skin: No rashes or petechiae noted. Psych: Normal affect.   LAB RESULTS:  Lab Results  Component Value Date   NA 137 03/21/2019   K 3.5 03/21/2019   CL 102 03/21/2019   CO2 26 03/21/2019   GLUCOSE 121 (H) 03/21/2019   BUN 12 03/21/2019   CREATININE 0.82 03/21/2019   CALCIUM 9.2 03/21/2019   PROT 7.2 03/21/2019   ALBUMIN 4.4  03/21/2019   AST 31 03/21/2019   ALT 29 03/21/2019   ALKPHOS 64 03/21/2019   BILITOT 0.5 03/21/2019   GFRNONAA >60 03/21/2019   GFRAA >60 03/21/2019    Lab Results  Component Value Date   WBC 3.2 (L) 03/21/2019   NEUTROABS 1.2 (L) 03/21/2019   HGB 14.8 03/21/2019   HCT 41.8 03/21/2019   MCV 92.5 03/21/2019   PLT 283 03/21/2019     STUDIES: No results found.  ASSESSMENT: Stage IIa Hodgkin's lymphoma, EBV positive.  PLAN:    1.  Hodgkin's lymphoma: CT scan on December 08, 2018 revealed a 6.2 cm nodal mass in the left neck.  Subsequent biopsy confirmed the diagnosis.  PET scan results from January 08, 2019 with bilateral neck lymphadenopathy and no other obvious evidence of disease confirming stage.  Bone marrow biopsy on January 19, 2019 revealed normal cellular marrow with trilineage hematopoiesis with no morphologic evidence of Hodgkin's lymphoma.  PFT and MUGA completed in [place] are reported as adequate to proceed with treatment.  Patient initiated cycle 1, day 1 of ABVD on February 07, 2019.  Proceed with cycle 2, day 15 of treatment today.  Plan is to complete cycle 2 and then repeat PET scan.  If results from prior report Deauville 1 or 2 he likely can complete treatment and possibly consider consolidation XRT.  If results report Deauville 3 or higher, will likely consider additional chemotherapy.  Return to clinic in 2 weeks for further evaluation and discussion of his PET scan results. 2.  Covid positive: Resolved. 3.  Insomnia: Patient was given a prescription for Ativan today. 4.  Nausea: Patient was given a prescription for ondansetron. 5.  Neutropenia: Mild, monitor.  Proceed with treatment as above.  Patient expressed understanding and was in agreement with this plan. He also understands that He can call clinic at any time with any questions, concerns, or complaints.   Cancer Staging Hodgkin lymphoma of lymph nodes of neck ([place]) Staging form: Hodgkin and Non-Hodgkin  Lymphoma, AJCC 8th Edition - Clinical stage from 01/19/2019: Stage II (Hodgkin lymphoma, A - Asymptomatic) - Signed by Lloyd Huger, MD on 01/19/2019   Lloyd Huger, MD   03/22/2019 6:39 AM

## 2019-03-20 ENCOUNTER — Other Ambulatory Visit: Payer: Self-pay | Admitting: Oncology

## 2019-03-20 DIAGNOSIS — C8171 Other classical Hodgkin lymphoma, lymph nodes of head, face, and neck: Secondary | ICD-10-CM

## 2019-03-21 ENCOUNTER — Inpatient Hospital Stay: Payer: BC Managed Care – PPO

## 2019-03-21 ENCOUNTER — Inpatient Hospital Stay (HOSPITAL_BASED_OUTPATIENT_CLINIC_OR_DEPARTMENT_OTHER): Payer: BC Managed Care – PPO | Admitting: Oncology

## 2019-03-21 ENCOUNTER — Inpatient Hospital Stay: Payer: BC Managed Care – PPO | Admitting: *Deleted

## 2019-03-21 ENCOUNTER — Other Ambulatory Visit: Payer: Self-pay

## 2019-03-21 ENCOUNTER — Other Ambulatory Visit: Payer: Self-pay | Admitting: Emergency Medicine

## 2019-03-21 VITALS — BP 135/81 | HR 110 | Temp 96.2°F | Resp 16 | Wt 197.3 lb

## 2019-03-21 DIAGNOSIS — Z5111 Encounter for antineoplastic chemotherapy: Secondary | ICD-10-CM | POA: Diagnosis not present

## 2019-03-21 DIAGNOSIS — Z79899 Other long term (current) drug therapy: Secondary | ICD-10-CM | POA: Diagnosis not present

## 2019-03-21 DIAGNOSIS — C8171 Other classical Hodgkin lymphoma, lymph nodes of head, face, and neck: Secondary | ICD-10-CM

## 2019-03-21 DIAGNOSIS — Z95828 Presence of other vascular implants and grafts: Secondary | ICD-10-CM

## 2019-03-21 DIAGNOSIS — Z8616 Personal history of COVID-19: Secondary | ICD-10-CM | POA: Diagnosis not present

## 2019-03-21 DIAGNOSIS — C8190 Hodgkin lymphoma, unspecified, unspecified site: Secondary | ICD-10-CM | POA: Diagnosis present

## 2019-03-21 DIAGNOSIS — F17210 Nicotine dependence, cigarettes, uncomplicated: Secondary | ICD-10-CM | POA: Diagnosis not present

## 2019-03-21 LAB — CBC WITH DIFFERENTIAL/PLATELET
Abs Immature Granulocytes: 0.05 10*3/uL (ref 0.00–0.07)
Basophils Absolute: 0.1 10*3/uL (ref 0.0–0.1)
Basophils Relative: 2 %
Eosinophils Absolute: 0.1 10*3/uL (ref 0.0–0.5)
Eosinophils Relative: 4 %
HCT: 41.8 % (ref 39.0–52.0)
Hemoglobin: 14.8 g/dL (ref 13.0–17.0)
Immature Granulocytes: 2 %
Lymphocytes Relative: 37 %
Lymphs Abs: 1.2 10*3/uL (ref 0.7–4.0)
MCH: 32.7 pg (ref 26.0–34.0)
MCHC: 35.4 g/dL (ref 30.0–36.0)
MCV: 92.5 fL (ref 80.0–100.0)
Monocytes Absolute: 0.6 10*3/uL (ref 0.1–1.0)
Monocytes Relative: 18 %
Neutro Abs: 1.2 10*3/uL — ABNORMAL LOW (ref 1.7–7.7)
Neutrophils Relative %: 37 %
Platelets: 283 10*3/uL (ref 150–400)
RBC: 4.52 MIL/uL (ref 4.22–5.81)
RDW: 13.7 % (ref 11.5–15.5)
WBC: 3.2 10*3/uL — ABNORMAL LOW (ref 4.0–10.5)
nRBC: 0 % (ref 0.0–0.2)

## 2019-03-21 LAB — COMPREHENSIVE METABOLIC PANEL
ALT: 29 U/L (ref 0–44)
AST: 31 U/L (ref 15–41)
Albumin: 4.4 g/dL (ref 3.5–5.0)
Alkaline Phosphatase: 64 U/L (ref 38–126)
Anion gap: 9 (ref 5–15)
BUN: 12 mg/dL (ref 6–20)
CO2: 26 mmol/L (ref 22–32)
Calcium: 9.2 mg/dL (ref 8.9–10.3)
Chloride: 102 mmol/L (ref 98–111)
Creatinine, Ser: 0.82 mg/dL (ref 0.61–1.24)
GFR calc Af Amer: >60 mL/min (ref >60)
GFR calc non Af Amer: >60 mL/min (ref >60)
Glucose, Bld: 121 mg/dL — ABNORMAL HIGH (ref 70–99)
Potassium: 3.5 mmol/L (ref 3.5–5.1)
Sodium: 137 mmol/L (ref 135–145)
Total Bilirubin: 0.5 mg/dL (ref 0.3–1.2)
Total Protein: 7.2 g/dL (ref 6.5–8.1)

## 2019-03-21 MED ORDER — DOXORUBICIN HCL CHEMO IV INJECTION 2 MG/ML
22.8000 mg/m2 | Freq: Once | INTRAVENOUS | Status: AC
  Administered 2019-03-21: 50 mg via INTRAVENOUS
  Filled 2019-03-21: qty 25

## 2019-03-21 MED ORDER — SODIUM CHLORIDE 0.9 % IV SOLN
Freq: Once | INTRAVENOUS | Status: AC
  Filled 2019-03-21: qty 250

## 2019-03-21 MED ORDER — SODIUM CHLORIDE 0.9% FLUSH
10.0000 mL | Freq: Once | INTRAVENOUS | Status: AC
  Administered 2019-03-21: 10 mL via INTRAVENOUS
  Filled 2019-03-21: qty 10

## 2019-03-21 MED ORDER — SODIUM CHLORIDE 0.9 % IV SOLN
10.0000 mg | Freq: Once | INTRAVENOUS | Status: AC
  Administered 2019-03-21: 10 mg via INTRAVENOUS
  Filled 2019-03-21: qty 10

## 2019-03-21 MED ORDER — HEPARIN SOD (PORK) LOCK FLUSH 100 UNIT/ML IV SOLN
INTRAVENOUS | Status: AC
  Filled 2019-03-21: qty 5

## 2019-03-21 MED ORDER — PALONOSETRON HCL INJECTION 0.25 MG/5ML
0.2500 mg | Freq: Once | INTRAVENOUS | Status: AC
  Administered 2019-03-21: 0.25 mg via INTRAVENOUS
  Filled 2019-03-21: qty 5

## 2019-03-21 MED ORDER — HEPARIN SOD (PORK) LOCK FLUSH 100 UNIT/ML IV SOLN
500.0000 [IU] | Freq: Once | INTRAVENOUS | Status: AC | PRN
  Administered 2019-03-21: 500 [IU]
  Filled 2019-03-21: qty 5

## 2019-03-21 MED ORDER — SODIUM CHLORIDE 0.9 % IV SOLN
10.0000 [IU]/m2 | Freq: Once | INTRAVENOUS | Status: AC
  Administered 2019-03-21: 22 [IU] via INTRAVENOUS
  Filled 2019-03-21: qty 7.33

## 2019-03-21 MED ORDER — SODIUM CHLORIDE 0.9 % IV SOLN
365.0000 mg/m2 | Freq: Once | INTRAVENOUS | Status: AC
  Administered 2019-03-21: 800 mg via INTRAVENOUS
  Filled 2019-03-21: qty 80

## 2019-03-21 MED ORDER — ONDANSETRON HCL 8 MG PO TABS: 8.0000 mg | ORAL_TABLET | Freq: Three times a day (TID) | ORAL | 2 refills | Status: DC | PRN

## 2019-03-21 MED ORDER — VINBLASTINE SULFATE CHEMO INJECTION 1 MG/ML
6.0000 mg/m2 | Freq: Once | INTRAVENOUS | Status: AC
  Administered 2019-03-21: 12:00:00 13.1 mg via INTRAVENOUS
  Filled 2019-03-21: qty 13.1

## 2019-03-21 MED ORDER — SODIUM CHLORIDE 0.9 % IV SOLN
150.0000 mg | Freq: Once | INTRAVENOUS | Status: AC
  Administered 2019-03-21: 150 mg via INTRAVENOUS
  Filled 2019-03-21: qty 150

## 2019-03-21 MED ORDER — LORAZEPAM 0.5 MG PO TABS: 0.5000 mg | ORAL_TABLET | Freq: Every day | ORAL | 0 refills | Status: DC

## 2019-03-21 NOTE — Progress Notes (Signed)
Pt here for follow up and to start ABVD at this facility. Was previously being treated in [place]. Pt tachycardic, reports discomfort and pain from port being accessed, did not use EMLA.

## 2019-03-21 NOTE — Progress Notes (Signed)
HR 110, ok to proceed per MD 

## 2019-03-30 NOTE — Progress Notes (Signed)
[facility]  Telephone:([phone] Fax:(336) [phone]  ID: [name] OB: 1995-04-18  MR#: [id number]  HEN#:[id number]  Patient Care Team: Patient, No Pcp Per as PCP - General (General Practice)  CHIEF COMPLAINT: Stage IIa Hodgkin's lymphoma  INTERVAL HISTORY: Patient returns to clinic today for further evaluation, discussion of his PET scan results, and consideration of cycle 3, day 1 of ABVD.  He continues to feel well and remains asymptomatic. Lymphadenopathy his neck is nearly resolved. He has no neurologic complaints.  He denies any recent fevers or illnesses.  He denies night sweats or unintentional weight loss.  He has no chest pain, shortness of breath, cough, or hemoptysis.  He denies any nausea, vomiting, constipation, or diarrhea.  He has no urinary complaints.  Patient offers no specific complaints today.    REVIEW OF SYSTEMS:   Review of Systems  Constitutional: Negative.  Negative for fever, malaise/fatigue and weight loss.  Respiratory: Negative.  Negative for cough, hemoptysis and shortness of breath.   Cardiovascular: Negative.  Negative for chest pain and leg swelling.  Gastrointestinal: Negative.  Negative for abdominal pain.  Genitourinary: Negative.   Musculoskeletal: Negative.  Negative for neck pain.  Skin: Negative.  Negative for rash.  Neurological: Negative.  Negative for dizziness, focal weakness, weakness and headaches.  Psychiatric/Behavioral: The patient has insomnia. The patient is not nervous/anxious.     As per HPI. Otherwise, a complete review of systems is negative.  PAST MEDICAL HISTORY: Past Medical History:  Diagnosis Date  . Attention deficit disorder (ADD)   . Seasonal allergies     PAST SURGICAL HISTORY: Negative.  FAMILY HISTORY: Family History  Problem Relation Age of Onset  . Cervical cancer Maternal Grandmother   . Non-Hodgkin's lymphoma Paternal Uncle     ADVANCED DIRECTIVES (Y/N):  N  HEALTH  MAINTENANCE: Social History   Tobacco Use  . Smoking status: Current Every Day Smoker    Packs/day: 0.25  . Smokeless tobacco: Never Used  Substance Use Topics  . Alcohol use: Yes    Comment: socially  . Drug use: Never     Colonoscopy:  PAP:  Bone density:  Lipid panel:  No Known Allergies  Current Outpatient Medications  Medication Sig Dispense Refill  . amphetamine-dextroamphetamine (ADDERALL) 20 MG tablet Take by mouth.    . lidocaine-prilocaine (EMLA) cream     . LORazepam (ATIVAN) 0.5 MG tablet Take 1 tablet (0.5 mg total) by mouth at bedtime. 30 tablet 0  . ondansetron (ZOFRAN) 8 MG tablet Take 1 tablet (8 mg total) by mouth every 8 (eight) hours as needed for nausea or vomiting. 30 tablet 2  . prochlorperazine (COMPAZINE) 10 MG tablet Take by mouth.    Marland Kitchen VYVANSE 60 MG capsule TK 1 C PO QD     No current facility-administered medications for this visit.    OBJECTIVE: Vitals:   04/04/19 1027  BP: 121/77  Pulse: (!) 110  Temp: 98.5 F (36.9 C)     Body mass index is 24.32 kg/m.    ECOG FS:0 - Asymptomatic  General: Well-developed, well-nourished, no acute distress. Eyes: Pink conjunctiva, anicteric sclera. HEENT: Normocephalic, moist mucous membranes. Lungs: No audible wheezing or coughing. Heart: Regular rate and rhythm. Abdomen: Soft, nontender, no obvious distention. Musculoskeletal: No edema, cyanosis, or clubbing. Neuro: Alert, answering all questions appropriately. Cranial nerves grossly intact. Skin: No rashes or petechiae noted. Psych: Normal affect.  LAB RESULTS:  Lab Results  Component Value Date   NA 138 04/04/2019  K 3.7 04/04/2019   CL 103 04/04/2019   CO2 25 04/04/2019   GLUCOSE 131 (H) 04/04/2019   BUN 9 04/04/2019   CREATININE 0.93 04/04/2019   CALCIUM 9.1 04/04/2019   PROT 6.9 04/04/2019   ALBUMIN 4.1 04/04/2019   AST 31 04/04/2019   ALT 28 04/04/2019   ALKPHOS 62 04/04/2019   BILITOT 0.5 04/04/2019   GFRNONAA >60  04/04/2019   GFRAA >60 04/04/2019    Lab Results  Component Value Date   WBC 3.0 (L) 04/04/2019   NEUTROABS 1.2 (L) 04/04/2019   HGB 14.0 04/04/2019   HCT 40.3 04/04/2019   MCV 93.1 04/04/2019   PLT 286 04/04/2019     STUDIES: NM PET Image Restag (PS) Skull Base To Thigh  Result Date: 04/04/2019 CLINICAL DATA:  Subsequent treatment strategy for Hodgkin's lymphoma. EXAM: NUCLEAR MEDICINE PET SKULL BASE TO THIGH TECHNIQUE: 10.12 mCi F-18 FDG was injected intravenously. Full-ring PET imaging was performed from the skull base to thigh after the radiotracer. CT data was obtained and used for attenuation correction and anatomic localization. Fasting blood glucose: 86 mg/dl COMPARISON:  PET-CT 01/08/2019 FINDINGS: Mediastinal blood pool activity: SUV max 1.90 Liver activity: SUV max 2.87 NECK: Significant reduction in left neck adenopathy when compared to the prior study. Left level 2 lymph node previously measured 3.4 cm and now measures 11 mm. Minimal residual hypermetabolism with SUV max of 3.07, just slightly above liver background (Deauville 4). This was previously 24.3. 11 mm right level 2 lymph node previously measured 13 mm. SUV max is 3.76 (Deauville 4) and was previously 6.66. Left supraclavicular lymph node measures 5.5 mm. It previously measured 11.5 mm. No hypermetabolism. Prior SUV max was 9.94 and is now 1.86 (Deauville 2). No new or progressive findings in the neck. There is some persistent hypermetabolism in the tonsillar regions bilaterally of uncertain significance. SUV max was previously 9.2-1 is now 6.77. Incidental CT findings: none CHEST: No axillary lymphadenopathy. No mediastinal or hilar adenopathy. No worrisome pulmonary lesions. Incidental CT findings: Port-A-Cath in good position without complicating features. ABDOMEN/PELVIS: No abnormal hypermetabolic activity within the liver, pancreas, adrenal glands, or spleen. No hypermetabolic lymph nodes in the abdomen or pelvis. There  was a weakly positive right femoral lymph node on the prior study. This measures 7.5 mm and previously measured 8.5 mm. No hypermetabolism (Deauville 1). Incidental CT findings: none SKELETON: No focal hypermetabolic activity to suggest skeletal metastasis. Incidental CT findings: none IMPRESSION: 1. PET-CT findings suggest an excellent response to treatment. Significant regression of the nodal disease in the neck and significant decrease in FDG uptake as detailed above. 2. No new or progressive findings. Electronically Signed   By: Marijo Sanes M.D.   On: 04/04/2019 11:23    ASSESSMENT: Stage IIa Hodgkin's lymphoma, EBV positive.  PLAN:    1.  Hodgkin's lymphoma: CT scan on December 08, 2018 revealed a 6.2 cm nodal mass in the left neck.  Subsequent biopsy confirmed the diagnosis.  PET scan results from January 08, 2019 with bilateral neck lymphadenopathy and no other obvious evidence of disease confirming stage.  Bone marrow biopsy on January 19, 2019 revealed normal cellular marrow with trilineage hematopoiesis with no morphologic evidence of Hodgkin's lymphoma.  PFT and MUGA completed in [place] are reported as adequate to proceed with treatment.  Patient initiated cycle 1, day 1 of ABVD on February 07, 2019.  PET scan results from April 04, 2019 reviewed independently and report as above with significant improvement of patient's  disease.  Per NCCN guidelines, will proceed with either 1 additional cycle of ABVD followed by XRT or 2 cycles of ABVD alone.  Awaiting input from patient's oncologist at [facility] as well.  Because of the delay in getting PET results, patient will return to clinic on Friday for cycle 3, day 1.   2.  Covid positive: Resolved. 3.  Insomnia: Continue Ativan as needed. 4.  Nausea: Continue ondansetron as needed. 5.  Neutropenia: Mild, monitor.  Proceed with treatment as above.  I spent a total of 30 minutes reviewing chart data, face-to-face evaluation with the patient,  counseling and coordination of care as detailed above.   Patient expressed understanding and was in agreement with this plan. He also understands that He can call clinic at any time with any questions, concerns, or complaints.   Cancer Staging Hodgkin lymphoma of lymph nodes of neck ([place]) Staging form: Hodgkin and Non-Hodgkin Lymphoma, AJCC 8th Edition - Clinical stage from 01/19/2019: Stage II (Hodgkin lymphoma, A - Asymptomatic) - Signed by Lloyd Huger, MD on 01/19/2019   Lloyd Huger, MD   04/04/2019 3:53 PM

## 2019-04-02 ENCOUNTER — Other Ambulatory Visit: Payer: Self-pay

## 2019-04-02 ENCOUNTER — Ambulatory Visit
Admission: RE | Admit: 2019-04-02 | Discharge: 2019-04-02 | Disposition: A | Discharge status: Home / Self Care | Payer: BC Managed Care – PPO | Source: Ambulatory Visit | Attending: Oncology | Admitting: Oncology

## 2019-04-02 DIAGNOSIS — C8171 Other classical Hodgkin lymphoma, lymph nodes of head, face, and neck: Secondary | ICD-10-CM

## 2019-04-03 ENCOUNTER — Encounter: Payer: Self-pay | Admitting: Oncology

## 2019-04-03 ENCOUNTER — Ambulatory Visit
Admission: RE | Admit: 2019-04-03 | Discharge: 2019-04-03 | Disposition: A | Discharge status: Home / Self Care | Payer: BC Managed Care – PPO | Source: Ambulatory Visit | Attending: Oncology | Admitting: Oncology

## 2019-04-03 DIAGNOSIS — C8171 Other classical Hodgkin lymphoma, lymph nodes of head, face, and neck: Secondary | ICD-10-CM | POA: Insufficient documentation

## 2019-04-03 LAB — GLUCOSE, CAPILLARY: Glucose-Capillary: 86 mg/dL (ref 70–99)

## 2019-04-03 MED ORDER — FLUDEOXYGLUCOSE F - 18 (FDG) INJECTION
10.2000 | Freq: Once | INTRAVENOUS | Status: AC | PRN
  Administered 2019-04-03: 10.12 via INTRAVENOUS

## 2019-04-03 NOTE — Progress Notes (Signed)
Patient prescreened for appointment. Patient has no concerns or questions.  

## 2019-04-04 ENCOUNTER — Telehealth: Payer: Self-pay | Admitting: Oncology

## 2019-04-04 ENCOUNTER — Inpatient Hospital Stay: Payer: BC Managed Care – PPO | Attending: Oncology

## 2019-04-04 ENCOUNTER — Other Ambulatory Visit: Payer: Self-pay | Admitting: Emergency Medicine

## 2019-04-04 ENCOUNTER — Inpatient Hospital Stay (HOSPITAL_BASED_OUTPATIENT_CLINIC_OR_DEPARTMENT_OTHER): Payer: BC Managed Care – PPO | Admitting: Oncology

## 2019-04-04 ENCOUNTER — Other Ambulatory Visit: Payer: Self-pay

## 2019-04-04 ENCOUNTER — Other Ambulatory Visit: Payer: Self-pay | Admitting: *Deleted

## 2019-04-04 ENCOUNTER — Inpatient Hospital Stay: Payer: BC Managed Care – PPO

## 2019-04-04 VITALS — BP 121/77 | HR 110 | Temp 98.5°F | Wt 199.8 lb

## 2019-04-04 DIAGNOSIS — Z5111 Encounter for antineoplastic chemotherapy: Secondary | ICD-10-CM | POA: Insufficient documentation

## 2019-04-04 DIAGNOSIS — Z79899 Other long term (current) drug therapy: Secondary | ICD-10-CM | POA: Diagnosis not present

## 2019-04-04 DIAGNOSIS — F17210 Nicotine dependence, cigarettes, uncomplicated: Secondary | ICD-10-CM | POA: Insufficient documentation

## 2019-04-04 DIAGNOSIS — C8171 Other classical Hodgkin lymphoma, lymph nodes of head, face, and neck: Secondary | ICD-10-CM

## 2019-04-04 DIAGNOSIS — D709 Neutropenia, unspecified: Secondary | ICD-10-CM | POA: Diagnosis not present

## 2019-04-04 DIAGNOSIS — R110 Nausea: Secondary | ICD-10-CM | POA: Diagnosis not present

## 2019-04-04 DIAGNOSIS — Z95828 Presence of other vascular implants and grafts: Secondary | ICD-10-CM

## 2019-04-04 DIAGNOSIS — G4700 Insomnia, unspecified: Secondary | ICD-10-CM | POA: Insufficient documentation

## 2019-04-04 LAB — CBC WITH DIFFERENTIAL/PLATELET
Abs Immature Granulocytes: 0.11 10*3/uL — ABNORMAL HIGH (ref 0.00–0.07)
Basophils Absolute: 0.1 10*3/uL (ref 0.0–0.1)
Basophils Relative: 2 %
Eosinophils Absolute: 0.2 10*3/uL (ref 0.0–0.5)
Eosinophils Relative: 6 %
HCT: 40.3 % (ref 39.0–52.0)
Hemoglobin: 14 g/dL (ref 13.0–17.0)
Immature Granulocytes: 4 %
Lymphocytes Relative: 29 %
Lymphs Abs: 0.9 10*3/uL (ref 0.7–4.0)
MCH: 32.3 pg (ref 26.0–34.0)
MCHC: 34.7 g/dL (ref 30.0–36.0)
MCV: 93.1 fL (ref 80.0–100.0)
Monocytes Absolute: 0.6 10*3/uL (ref 0.1–1.0)
Monocytes Relative: 21 %
Neutro Abs: 1.2 10*3/uL — ABNORMAL LOW (ref 1.7–7.7)
Neutrophils Relative %: 38 %
Platelets: 286 10*3/uL (ref 150–400)
RBC: 4.33 MIL/uL (ref 4.22–5.81)
RDW: 14.3 % (ref 11.5–15.5)
WBC: 3 10*3/uL — ABNORMAL LOW (ref 4.0–10.5)
nRBC: 0 % (ref 0.0–0.2)

## 2019-04-04 LAB — COMPREHENSIVE METABOLIC PANEL
ALT: 28 U/L (ref 0–44)
AST: 31 U/L (ref 15–41)
Albumin: 4.1 g/dL (ref 3.5–5.0)
Alkaline Phosphatase: 62 U/L (ref 38–126)
Anion gap: 10 (ref 5–15)
BUN: 9 mg/dL (ref 6–20)
CO2: 25 mmol/L (ref 22–32)
Calcium: 9.1 mg/dL (ref 8.9–10.3)
Chloride: 103 mmol/L (ref 98–111)
Creatinine, Ser: 0.93 mg/dL (ref 0.61–1.24)
GFR calc Af Amer: >60 mL/min (ref >60)
GFR calc non Af Amer: >60 mL/min (ref >60)
Glucose, Bld: 131 mg/dL — ABNORMAL HIGH (ref 70–99)
Potassium: 3.7 mmol/L (ref 3.5–5.1)
Sodium: 138 mmol/L (ref 135–145)
Total Bilirubin: 0.5 mg/dL (ref 0.3–1.2)
Total Protein: 6.9 g/dL (ref 6.5–8.1)

## 2019-04-04 MED ORDER — SODIUM CHLORIDE 0.9% FLUSH
10.0000 mL | Freq: Once | INTRAVENOUS | Status: AC
  Administered 2019-04-04: 10 mL via INTRAVENOUS
  Filled 2019-04-04: qty 10

## 2019-04-04 NOTE — Telephone Encounter (Signed)
Left pt VM about tentative appt schedule for 04/06/19. Informed pt that he would receive acall from Dr. Grayland Ormond or Clarise Cruz tomorrow after conversation with another doctor was completed.

## 2019-04-06 ENCOUNTER — Telehealth: Payer: Self-pay | Admitting: *Deleted

## 2019-04-06 ENCOUNTER — Other Ambulatory Visit: Payer: Self-pay

## 2019-04-06 ENCOUNTER — Other Ambulatory Visit: Payer: Self-pay | Admitting: Emergency Medicine

## 2019-04-06 ENCOUNTER — Encounter: Payer: Self-pay | Admitting: Oncology

## 2019-04-06 ENCOUNTER — Inpatient Hospital Stay: Payer: BC Managed Care – PPO

## 2019-04-06 ENCOUNTER — Ambulatory Visit: Payer: BC Managed Care – PPO | Admitting: Oncology

## 2019-04-06 VITALS — BP 145/83 | HR 109 | Temp 96.9°F | Resp 18

## 2019-04-06 DIAGNOSIS — C8171 Other classical Hodgkin lymphoma, lymph nodes of head, face, and neck: Secondary | ICD-10-CM

## 2019-04-06 MED ORDER — VINBLASTINE SULFATE CHEMO INJECTION 1 MG/ML
6.0000 mg/m2 | Freq: Once | INTRAVENOUS | Status: AC
  Administered 2019-04-06: 13.1 mg via INTRAVENOUS
  Filled 2019-04-06: qty 13.1

## 2019-04-06 MED ORDER — PALONOSETRON HCL INJECTION 0.25 MG/5ML
0.2500 mg | Freq: Once | INTRAVENOUS | Status: AC
  Administered 2019-04-06: 0.25 mg via INTRAVENOUS
  Filled 2019-04-06: qty 5

## 2019-04-06 MED ORDER — SODIUM CHLORIDE 0.9 % IV SOLN
10.0000 mg | Freq: Once | INTRAVENOUS | Status: AC
  Administered 2019-04-06: 10 mg via INTRAVENOUS
  Filled 2019-04-06: qty 1

## 2019-04-06 MED ORDER — LORAZEPAM 0.5 MG PO TABS: 0.5000 mg | ORAL_TABLET | Freq: Every day | ORAL | 0 refills | Status: DC

## 2019-04-06 MED ORDER — SODIUM CHLORIDE 0.9% FLUSH
10.0000 mL | INTRAVENOUS | Status: DC | PRN
  Administered 2019-04-06: 11:00:00 10 mL
  Filled 2019-04-06: qty 10

## 2019-04-06 MED ORDER — ONDANSETRON HCL 8 MG PO TABS: 8.0000 mg | ORAL_TABLET | Freq: Three times a day (TID) | ORAL | 2 refills | Status: DC | PRN

## 2019-04-06 MED ORDER — DOXORUBICIN HCL CHEMO IV INJECTION 2 MG/ML
23.0000 mg/m2 | Freq: Once | INTRAVENOUS | Status: AC
  Administered 2019-04-06: 50 mg via INTRAVENOUS
  Filled 2019-04-06: qty 25

## 2019-04-06 MED ORDER — SODIUM CHLORIDE 0.9 % IV SOLN
365.0000 mg/m2 | Freq: Once | INTRAVENOUS | Status: AC
  Administered 2019-04-06: 13:00:00 800 mg via INTRAVENOUS
  Filled 2019-04-06: qty 80

## 2019-04-06 MED ORDER — HEPARIN SOD (PORK) LOCK FLUSH 100 UNIT/ML IV SOLN
INTRAVENOUS | Status: AC
  Filled 2019-04-06: qty 5

## 2019-04-06 MED ORDER — SODIUM CHLORIDE 0.9 % IV SOLN
150.0000 mg | Freq: Once | INTRAVENOUS | Status: AC
  Administered 2019-04-06: 11:00:00 150 mg via INTRAVENOUS
  Filled 2019-04-06: qty 5

## 2019-04-06 MED ORDER — SODIUM CHLORIDE 0.9 % IV SOLN
Freq: Once | INTRAVENOUS | Status: AC
  Filled 2019-04-06: qty 250

## 2019-04-06 MED ORDER — SODIUM CHLORIDE 0.9 % IV SOLN
10.0000 [IU]/m2 | Freq: Once | INTRAVENOUS | Status: AC
  Administered 2019-04-06: 12:00:00 22 [IU] via INTRAVENOUS
  Filled 2019-04-06: qty 7.33

## 2019-04-06 MED ORDER — HEPARIN SOD (PORK) LOCK FLUSH 100 UNIT/ML IV SOLN
500.0000 [IU] | Freq: Once | INTRAVENOUS | Status: AC | PRN
  Administered 2019-04-06: 14:00:00 500 [IU]
  Filled 2019-04-06: qty 5

## 2019-04-06 NOTE — Telephone Encounter (Signed)
Will do this Monday.

## 2019-04-06 NOTE — Telephone Encounter (Signed)
Patient called reporting that pharmacy will not let him have his Lorazepam for another 10 days unless Dr Grayland Ormond calls to approve early refill

## 2019-04-06 NOTE — Progress Notes (Signed)
Pulse Rate: 109. MD, Dr. Grayland Ormond, notified and aware. Per MD order: proceed with scheduled ABVD treatment today.

## 2019-04-09 ENCOUNTER — Other Ambulatory Visit: Payer: Self-pay | Admitting: Emergency Medicine

## 2019-04-09 DIAGNOSIS — C8198 Hodgkin lymphoma, unspecified, lymph nodes of multiple sites: Secondary | ICD-10-CM

## 2019-04-09 MED ORDER — LORAZEPAM 1 MG PO TABS: 1.0000 mg | ORAL_TABLET | Freq: Every evening | ORAL | 0 refills | Status: DC | PRN

## 2019-04-11 ENCOUNTER — Encounter: Payer: Self-pay | Admitting: Emergency Medicine

## 2019-04-11 ENCOUNTER — Encounter: Payer: Self-pay | Admitting: Oncology

## 2019-04-19 NOTE — Progress Notes (Signed)
[facility]  [phone] [phone]  ID: [name] OB: [birth year]  MR#: [id number]  SNK#:[id number]  Patient Care Team: Patient, No Pcp Per as PCP - General (General Practice)  CHIEF COMPLAINT: Stage IIa Hodgkin's lymphoma  INTERVAL HISTORY: Patient returns to clinic today for further evaluation and consideration of cycle 3, day 15 of ABVD.  He continues to feel well and remains asymptomatic.  He is tolerating his treatments without significant side effects.  The lymphadenopathy in his neck is essentially resolved. He has no neurologic complaints.  He denies any recent fevers or illnesses.  He denies night sweats or unintentional weight loss.  He has no chest pain, shortness of breath, cough, or hemoptysis.  He denies any nausea, vomiting, constipation, or diarrhea.  He has no urinary complaints.  Patient offers no specific complaints today.  REVIEW OF SYSTEMS:   Review of Systems  Constitutional: Negative.  Negative for fever, malaise/fatigue and weight loss.  Respiratory: Negative.  Negative for cough, hemoptysis and shortness of breath.   Cardiovascular: Negative.  Negative for chest pain and leg swelling.  Gastrointestinal: Negative.  Negative for abdominal pain.  Genitourinary: Negative.   Musculoskeletal: Negative.  Negative for neck pain.  Skin: Negative.  Negative for rash.  Neurological: Negative.  Negative for dizziness, focal weakness, weakness and headaches.  Psychiatric/Behavioral: The patient has insomnia. The patient is not nervous/anxious.     As per HPI. Otherwise, a complete review of systems is negative.  PAST MEDICAL HISTORY: Past Medical History:  Diagnosis Date  . Attention deficit disorder (ADD)   . Seasonal allergies     PAST SURGICAL HISTORY: Negative.  FAMILY HISTORY: Family History  Problem Relation Age of Onset  . Cervical cancer Maternal Grandmother   . Non-Hodgkin's lymphoma Paternal Uncle     ADVANCED  DIRECTIVES (Y/N):  N  HEALTH MAINTENANCE: Social History   Tobacco Use  . Smoking status: Current Every Day Smoker    Packs/day: 0.25  . Smokeless tobacco: Never Used  Substance Use Topics  . Alcohol use: Yes    Comment: socially  . Drug use: Never     Colonoscopy:  PAP:  Bone density:  Lipid panel:  No Known Allergies  Current Outpatient Medications  Medication Sig Dispense Refill  . amphetamine-dextroamphetamine (ADDERALL) 20 MG tablet Take by mouth.    . lidocaine-prilocaine (EMLA) cream     . LORazepam (ATIVAN) 1 MG tablet Take 1 tablet (1 mg total) by mouth at bedtime as needed for anxiety. 30 tablet 0  . ondansetron (ZOFRAN) 8 MG tablet Take 1 tablet (8 mg total) by mouth every 8 (eight) hours as needed for nausea or vomiting. 30 tablet 2  . prochlorperazine (COMPAZINE) 10 MG tablet Take by mouth.    Marland Kitchen VYVANSE 60 MG capsule TK 1 C PO QD     No current facility-administered medications for this visit.    OBJECTIVE: There were no vitals filed for this visit.   There is no height or weight on file to calculate BMI.    ECOG FS:0 - Asymptomatic  General: Well-developed, well-nourished, no acute distress. Eyes: Pink conjunctiva, anicteric sclera. HEENT: Normocephalic, moist mucous membranes.  No palpable lymphadenopathy Lungs: No audible wheezing or coughing. Heart: Regular rate and rhythm. Abdomen: Soft, nontender, no obvious distention. Musculoskeletal: No edema, cyanosis, or clubbing. Neuro: Alert, answering all questions appropriately. Cranial nerves grossly intact. Skin: No rashes or petechiae noted. Psych: Normal affect.   LAB RESULTS:  Lab Results  Component  Value Date   NA 137 04/23/2019   K 3.6 04/23/2019   CL 101 04/23/2019   CO2 28 04/23/2019   GLUCOSE 150 (H) 04/23/2019   BUN 11 04/23/2019   CREATININE 1.00 04/23/2019   CALCIUM 9.2 04/23/2019   PROT 7.1 04/23/2019   ALBUMIN 4.2 04/23/2019   AST 35 04/23/2019   ALT 32 04/23/2019   ALKPHOS 79  04/23/2019   BILITOT 0.6 04/23/2019   GFRNONAA >60 04/23/2019   GFRAA >60 04/23/2019    Lab Results  Component Value Date   WBC 3.0 (L) 04/23/2019   NEUTROABS 0.6 (L) 04/23/2019   HGB 14.8 04/23/2019   HCT 42.9 04/23/2019   MCV 93.7 04/23/2019   PLT 245 04/23/2019     STUDIES: NM PET Image Restag (PS) Skull Base To Thigh  Result Date: 04/04/2019 CLINICAL DATA:  Subsequent treatment strategy for Hodgkin's lymphoma. EXAM: NUCLEAR MEDICINE PET SKULL BASE TO THIGH TECHNIQUE: 10.12 mCi F-18 FDG was injected intravenously. Full-ring PET imaging was performed from the skull base to thigh after the radiotracer. CT data was obtained and used for attenuation correction and anatomic localization. Fasting blood glucose: 86 mg/dl COMPARISON:  PET-CT 01/08/2019 FINDINGS: Mediastinal blood pool activity: SUV max 1.90 Liver activity: SUV max 2.87 NECK: Significant reduction in left neck adenopathy when compared to the prior study. Left level 2 lymph node previously measured 3.4 cm and now measures 11 mm. Minimal residual hypermetabolism with SUV max of 3.07, just slightly above liver background (Deauville 4). This was previously 24.3. 11 mm right level 2 lymph node previously measured 13 mm. SUV max is 3.76 (Deauville 4) and was previously 6.66. Left supraclavicular lymph node measures 5.5 mm. It previously measured 11.5 mm. No hypermetabolism. Prior SUV max was 9.94 and is now 1.86 (Deauville 2). No new or progressive findings in the neck. There is some persistent hypermetabolism in the tonsillar regions bilaterally of uncertain significance. SUV max was previously 9.2-1 is now 6.77. Incidental CT findings: none CHEST: No axillary lymphadenopathy. No mediastinal or hilar adenopathy. No worrisome pulmonary lesions. Incidental CT findings: Port-A-Cath in good position without complicating features. ABDOMEN/PELVIS: No abnormal hypermetabolic activity within the liver, pancreas, adrenal glands, or spleen. No  hypermetabolic lymph nodes in the abdomen or pelvis. There was a weakly positive right femoral lymph node on the prior study. This measures 7.5 mm and previously measured 8.5 mm. No hypermetabolism (Deauville 1). Incidental CT findings: none SKELETON: No focal hypermetabolic activity to suggest skeletal metastasis. Incidental CT findings: none IMPRESSION: 1. PET-CT findings suggest an excellent response to treatment. Significant regression of the nodal disease in the neck and significant decrease in FDG uptake as detailed above. 2. No new or progressive findings. Electronically Signed   By: Marijo Sanes M.D.   On: 04/04/2019 11:23    ASSESSMENT: Stage IIa Hodgkin's lymphoma, EBV positive.  PLAN:    1.  Hodgkin's lymphoma: CT scan on December 08, 2018 revealed a 6.2 cm nodal mass in the left neck.  Subsequent biopsy confirmed the diagnosis.  PET scan results from January 08, 2019 with bilateral neck lymphadenopathy and no other obvious evidence of disease confirming stage.  Bone marrow biopsy on January 19, 2019 revealed normal cellular marrow with trilineage hematopoiesis with no morphologic evidence of Hodgkin's lymphoma.  PFT and MUGA completed in [place] are reported as adequate to proceed with treatment.  Patient initiated cycle 1, day 1 of ABVD on February 07, 2019.  PET scan results from April 04, 2019 reviewed independently  and reported as above with significant improvement of patient's disease.  Per NCCN guidelines, will proceed with 2 cycles additional cycles of ABVD or 4 treatments and then repeat PET scan.  Given patient's neutropenia, he will not receive treatment today.  We will add Udenyca and with the remainder of his cycles.  Return to clinic in 1 week for further evaluation and reconsideration of cycle 3, day 15. 2.  Covid positive: Resolved. 3.  Insomnia: Continue Ativan as needed. 4.  Nausea: Continue ondansetron as needed. 5.  Neutropenia: Delay treatment and add Udenyca as above.  I  spent a total of 30 minutes reviewing chart data, face-to-face evaluation with the patient, counseling and coordination of care as detailed above.    Patient expressed understanding and was in agreement with this plan. He also understands that He can call clinic at any time with any questions, concerns, or complaints.   Cancer Staging Hodgkin lymphoma of lymph nodes of neck ([place]) Staging form: Hodgkin and Non-Hodgkin Lymphoma, AJCC 8th Edition - Clinical stage from 01/19/2019: Stage II (Hodgkin lymphoma, A - Asymptomatic) - Signed by Lloyd Huger, MD on 01/19/2019   Lloyd Huger, MD   04/24/2019 6:25 AM

## 2019-04-23 ENCOUNTER — Inpatient Hospital Stay: Payer: BC Managed Care – PPO

## 2019-04-23 ENCOUNTER — Inpatient Hospital Stay (HOSPITAL_BASED_OUTPATIENT_CLINIC_OR_DEPARTMENT_OTHER): Payer: BC Managed Care – PPO | Admitting: Oncology

## 2019-04-23 ENCOUNTER — Other Ambulatory Visit: Payer: Self-pay

## 2019-04-23 DIAGNOSIS — C8171 Other classical Hodgkin lymphoma, lymph nodes of head, face, and neck: Secondary | ICD-10-CM

## 2019-04-23 LAB — CBC WITH DIFFERENTIAL/PLATELET
Abs Immature Granulocytes: 0.09 10*3/uL — ABNORMAL HIGH (ref 0.00–0.07)
Basophils Absolute: 0.1 10*3/uL (ref 0.0–0.1)
Basophils Relative: 2 %
Eosinophils Absolute: 0.3 10*3/uL (ref 0.0–0.5)
Eosinophils Relative: 9 %
HCT: 42.9 % (ref 39.0–52.0)
Hemoglobin: 14.8 g/dL (ref 13.0–17.0)
Immature Granulocytes: 3 %
Lymphocytes Relative: 38 %
Lymphs Abs: 1.1 10*3/uL (ref 0.7–4.0)
MCH: 32.3 pg (ref 26.0–34.0)
MCHC: 34.5 g/dL (ref 30.0–36.0)
MCV: 93.7 fL (ref 80.0–100.0)
Monocytes Absolute: 0.9 10*3/uL (ref 0.1–1.0)
Monocytes Relative: 29 %
Neutro Abs: 0.6 10*3/uL — ABNORMAL LOW (ref 1.7–7.7)
Neutrophils Relative %: 19 %
Platelets: 245 10*3/uL (ref 150–400)
RBC: 4.58 MIL/uL (ref 4.22–5.81)
RDW: 13.9 % (ref 11.5–15.5)
WBC: 3 10*3/uL — ABNORMAL LOW (ref 4.0–10.5)
nRBC: 0 % (ref 0.0–0.2)

## 2019-04-23 LAB — COMPREHENSIVE METABOLIC PANEL
ALT: 32 U/L (ref 0–44)
AST: 35 U/L (ref 15–41)
Albumin: 4.2 g/dL (ref 3.5–5.0)
Alkaline Phosphatase: 79 U/L (ref 38–126)
Anion gap: 8 (ref 5–15)
BUN: 11 mg/dL (ref 6–20)
CO2: 28 mmol/L (ref 22–32)
Calcium: 9.2 mg/dL (ref 8.9–10.3)
Chloride: 101 mmol/L (ref 98–111)
Creatinine, Ser: 1 mg/dL (ref 0.61–1.24)
GFR calc Af Amer: >60 mL/min (ref >60)
GFR calc non Af Amer: >60 mL/min (ref >60)
Glucose, Bld: 150 mg/dL — ABNORMAL HIGH (ref 70–99)
Potassium: 3.6 mmol/L (ref 3.5–5.1)
Sodium: 137 mmol/L (ref 135–145)
Total Bilirubin: 0.6 mg/dL (ref 0.3–1.2)
Total Protein: 7.1 g/dL (ref 6.5–8.1)

## 2019-04-23 MED ORDER — HEPARIN SOD (PORK) LOCK FLUSH 100 UNIT/ML IV SOLN
500.0000 [IU] | Freq: Once | INTRAVENOUS | Status: AC
  Administered 2019-04-23: 11:00:00 500 [IU] via INTRAVENOUS
  Filled 2019-04-23: qty 5

## 2019-04-23 MED ORDER — SODIUM CHLORIDE 0.9% FLUSH
10.0000 mL | INTRAVENOUS | Status: DC | PRN
  Administered 2019-04-23: 10 mL via INTRAVENOUS
  Filled 2019-04-23: qty 10

## 2019-04-23 NOTE — Progress Notes (Signed)
Pt states he does not see a difference in breathing with normal ADLs. Pt reports with increased exercise such as when playing basketball, he did notice that he became "more winded". Pt reports that prior to playing this past time it had been "a while".  MD aware pt reports that he has noticed in the past month "feeling cold" and that this was reported to MD at last visit and that the feeling has continued. MD aware. Pt denies chest pain or any other concerns at this time.  WBC 3.0, ANC 0.6. Dr. Grayland Ormond at chairside discussing plan with patient. Per Dr. Grayland Ormond hold treatment at this time. Pt stable at discharge.

## 2019-04-25 ENCOUNTER — Telehealth: Payer: Self-pay | Admitting: *Deleted

## 2019-04-25 NOTE — Telephone Encounter (Signed)
Patient mother called requesting a few minutes of Dr Gary Fleet time to discuss her sons care as his treatment was held due to his blood counts. Please return her call [phone]

## 2019-04-25 NOTE — Telephone Encounter (Signed)
I do not have time today, but will ensure we call her during the next clinic visit to ensure all questions are answered to their satisfaction.

## 2019-04-25 NOTE — Telephone Encounter (Signed)
I spoke with Mrs Woodridge and answered her questions and advised her that she can be on phone at his next visit. She thanked me for answering her questions related to low wbc and injections after chemotherapy.

## 2019-04-28 NOTE — Progress Notes (Signed)
[place]  [phone] [phone]  ID: [name] OB: [birth year]  MR#: [id number]  XYB#:[id number]  Patient Care Team: Patient, No Pcp Per as PCP - General (General Practice)  CHIEF COMPLAINT: Stage IIa Hodgkin's lymphoma  INTERVAL HISTORY: Patient returns to clinic today for further evaluation and reconsideration of cycle 3, day 15 of ABVD.  He continues to feel well and remains asymptomatic.  He is tolerating his treatments without significant side effects. The lymphadenopathy in his neck is essentially resolved. He has no neurologic complaints.  He denies any recent fevers or illnesses.  He denies night sweats or unintentional weight loss.  He has no chest pain, shortness of breath, cough, or hemoptysis.  He denies any nausea, vomiting, constipation, or diarrhea.  He has no urinary complaints.  Patient offers no specific complaints today.  REVIEW OF SYSTEMS:   Review of Systems  Constitutional: Negative.  Negative for fever, malaise/fatigue and weight loss.  Respiratory: Negative.  Negative for cough, hemoptysis and shortness of breath.   Cardiovascular: Negative.  Negative for chest pain and leg swelling.  Gastrointestinal: Negative.  Negative for abdominal pain.  Genitourinary: Negative.   Musculoskeletal: Negative.  Negative for neck pain.  Skin: Negative.  Negative for rash.  Neurological: Negative.  Negative for dizziness, focal weakness, weakness and headaches.  Psychiatric/Behavioral: The patient has insomnia. The patient is not nervous/anxious.     As per HPI. Otherwise, a complete review of systems is negative.  PAST MEDICAL HISTORY: Past Medical History:  Diagnosis Date  . Attention deficit disorder (ADD)   . Seasonal allergies     PAST SURGICAL HISTORY: Negative.  FAMILY HISTORY: Family History  Problem Relation Age of Onset  . Cervical cancer Maternal Grandmother   . Non-Hodgkin's lymphoma Paternal Uncle     ADVANCED  DIRECTIVES (Y/N):  N  HEALTH MAINTENANCE: Social History   Tobacco Use  . Smoking status: Current Every Day Smoker    Packs/day: 0.25  . Smokeless tobacco: Never Used  Substance Use Topics  . Alcohol use: Yes    Comment: socially  . Drug use: Never     Colonoscopy:  PAP:  Bone density:  Lipid panel:  No Known Allergies  Current Outpatient Medications  Medication Sig Dispense Refill  . amphetamine-dextroamphetamine (ADDERALL) 20 MG tablet Take by mouth.    . lidocaine-prilocaine (EMLA) cream     . LORazepam (ATIVAN) 1 MG tablet Take 1 tablet (1 mg total) by mouth at bedtime as needed for anxiety. 30 tablet 0  . ondansetron (ZOFRAN) 8 MG tablet Take 1 tablet (8 mg total) by mouth every 8 (eight) hours as needed for nausea or vomiting. 30 tablet 2  . prochlorperazine (COMPAZINE) 10 MG tablet Take by mouth.    Marland Kitchen VYVANSE 60 MG capsule TK 1 C PO QD     No current facility-administered medications for this visit.   Facility-Administered Medications Ordered in Other Visits  Medication Dose Route Frequency Provider Last Rate Last Admin  . bleomycin (BLEOCIN) 22 Units in sodium chloride 0.9 % 50 mL chemo infusion  10 Units/m2 (Treatment Plan Recorded) Intravenous Once Lloyd Huger, MD 344 mL/hr at 04/30/19 1407 22 Units at 04/30/19 1407  . dacarbazine (DTIC) 800 mg in sodium chloride 0.9 % 250 mL chemo infusion  365 mg/m2 (Treatment Plan Recorded) Intravenous Once Lloyd Huger, MD      . heparin lock flush 100 unit/mL  500 Units Intracatheter Once PRN Lloyd Huger, MD  OBJECTIVE: Vitals:   04/30/19 1122  BP: 131/75  Pulse: (!) 127  Resp: 16  Temp: (!) 96.7 F (35.9 C)  SpO2: 100%     Body mass index is 24.52 kg/m.    ECOG FS:0 - Asymptomatic  General: Well-developed, well-nourished, no acute distress. Eyes: Pink conjunctiva, anicteric sclera. HEENT: Normocephalic, moist mucous membranes.  No palpable lymphadenopathy. Lungs: No audible wheezing  or coughing. Heart: Regular rate and rhythm. Abdomen: Soft, nontender, no obvious distention. Musculoskeletal: No edema, cyanosis, or clubbing. Neuro: Alert, answering all questions appropriately. Cranial nerves grossly intact. Skin: No rashes or petechiae noted. Psych: Normal affect.    LAB RESULTS:  Lab Results  Component Value Date   NA 137 04/30/2019   K 3.5 04/30/2019   CL 100 04/30/2019   CO2 26 04/30/2019   GLUCOSE 147 (H) 04/30/2019   BUN 11 04/30/2019   CREATININE 0.87 04/30/2019   CALCIUM 9.2 04/30/2019   PROT 7.2 04/30/2019   ALBUMIN 4.2 04/30/2019   AST 34 04/30/2019   ALT 31 04/30/2019   ALKPHOS 65 04/30/2019   BILITOT 0.7 04/30/2019   GFRNONAA >60 04/30/2019   GFRAA >60 04/30/2019    Lab Results  Component Value Date   WBC 7.4 04/30/2019   NEUTROABS 4.7 04/30/2019   HGB 15.2 04/30/2019   HCT 43.8 04/30/2019   MCV 93.0 04/30/2019   PLT 275 04/30/2019     STUDIES: NM PET Image Restag (PS) Skull Base To Thigh  Result Date: 04/04/2019 CLINICAL DATA:  Subsequent treatment strategy for Hodgkin's lymphoma. EXAM: NUCLEAR MEDICINE PET SKULL BASE TO THIGH TECHNIQUE: 10.12 mCi F-18 FDG was injected intravenously. Full-ring PET imaging was performed from the skull base to thigh after the radiotracer. CT data was obtained and used for attenuation correction and anatomic localization. Fasting blood glucose: 86 mg/dl COMPARISON:  PET-CT 01/08/2019 FINDINGS: Mediastinal blood pool activity: SUV max 1.90 Liver activity: SUV max 2.87 NECK: Significant reduction in left neck adenopathy when compared to the prior study. Left level 2 lymph node previously measured 3.4 cm and now measures 11 mm. Minimal residual hypermetabolism with SUV max of 3.07, just slightly above liver background (Deauville 4). This was previously 24.3. 11 mm right level 2 lymph node previously measured 13 mm. SUV max is 3.76 (Deauville 4) and was previously 6.66. Left supraclavicular lymph node measures  5.5 mm. It previously measured 11.5 mm. No hypermetabolism. Prior SUV max was 9.94 and is now 1.86 (Deauville 2). No new or progressive findings in the neck. There is some persistent hypermetabolism in the tonsillar regions bilaterally of uncertain significance. SUV max was previously 9.2-1 is now 6.77. Incidental CT findings: none CHEST: No axillary lymphadenopathy. No mediastinal or hilar adenopathy. No worrisome pulmonary lesions. Incidental CT findings: Port-A-Cath in good position without complicating features. ABDOMEN/PELVIS: No abnormal hypermetabolic activity within the liver, pancreas, adrenal glands, or spleen. No hypermetabolic lymph nodes in the abdomen or pelvis. There was a weakly positive right femoral lymph node on the prior study. This measures 7.5 mm and previously measured 8.5 mm. No hypermetabolism (Deauville 1). Incidental CT findings: none SKELETON: No focal hypermetabolic activity to suggest skeletal metastasis. Incidental CT findings: none IMPRESSION: 1. PET-CT findings suggest an excellent response to treatment. Significant regression of the nodal disease in the neck and significant decrease in FDG uptake as detailed above. 2. No new or progressive findings. Electronically Signed   By: Marijo Sanes M.D.   On: 04/04/2019 11:23    ASSESSMENT: Stage IIa Hodgkin's lymphoma,  EBV positive.  PLAN:    1.  Hodgkin's lymphoma: CT scan on December 08, 2018 revealed a 6.2 cm nodal mass in the left neck.  Subsequent biopsy confirmed the diagnosis.  PET scan results from January 08, 2019 with bilateral neck lymphadenopathy and no other obvious evidence of disease confirming stage.  Bone marrow biopsy on January 19, 2019 revealed normal cellular marrow with trilineage hematopoiesis with no morphologic evidence of Hodgkin's lymphoma.  PFT and MUGA completed in [place] are reported as adequate to proceed with treatment.  Patient initiated cycle 1, day 1 of ABVD on February 07, 2019.  PET scan results  from April 04, 2019 reviewed independently with significant improvement of patient's disease.  Per NCCN guidelines, will proceed with 2 cycles additional cycles of ABVD or 4 treatments and then repeat PET scan.  Given his history of neutropenia, patient will require a Udenyca for the remainder of his treatments.  Proceed with cycle 3, day 15 of ABVD.  Return to clinic in 2 days for Udenyca only and then in 2 weeks for further evaluation and consideration of cycle 4, day 1.   2.  Covid positive: Resolved. 3.  Insomnia: Continue Ativan as needed. 4.  Nausea: Patient does not complain of this today.  Continue ondansetron as needed. 5.  Neutropenia: Proceed with treatment as above.  Udenyca for the remainder of the cycles.   Patient expressed understanding and was in agreement with this plan. He also understands that He can call clinic at any time with any questions, concerns, or complaints.   Cancer Staging Hodgkin lymphoma of lymph nodes of neck ([place]) Staging form: Hodgkin and Non-Hodgkin Lymphoma, AJCC 8th Edition - Clinical stage from 01/19/2019: Stage II (Hodgkin lymphoma, A - Asymptomatic) - Signed by Lloyd Huger, MD on 01/19/2019   Lloyd Huger, MD   04/30/2019 2:08 PM

## 2019-04-30 ENCOUNTER — Encounter: Payer: Self-pay | Admitting: Oncology

## 2019-04-30 ENCOUNTER — Inpatient Hospital Stay: Payer: BC Managed Care – PPO | Attending: Oncology

## 2019-04-30 ENCOUNTER — Inpatient Hospital Stay: Payer: BC Managed Care – PPO

## 2019-04-30 ENCOUNTER — Inpatient Hospital Stay (HOSPITAL_BASED_OUTPATIENT_CLINIC_OR_DEPARTMENT_OTHER): Payer: BC Managed Care – PPO | Admitting: Oncology

## 2019-04-30 ENCOUNTER — Other Ambulatory Visit: Payer: Self-pay

## 2019-04-30 VITALS — HR 115

## 2019-04-30 VITALS — BP 131/75 | HR 127 | Temp 96.7°F | Resp 16 | Wt 201.4 lb

## 2019-04-30 DIAGNOSIS — C8171 Other classical Hodgkin lymphoma, lymph nodes of head, face, and neck: Secondary | ICD-10-CM

## 2019-04-30 DIAGNOSIS — Z79899 Other long term (current) drug therapy: Secondary | ICD-10-CM | POA: Insufficient documentation

## 2019-04-30 DIAGNOSIS — F17210 Nicotine dependence, cigarettes, uncomplicated: Secondary | ICD-10-CM | POA: Insufficient documentation

## 2019-04-30 DIAGNOSIS — Z5111 Encounter for antineoplastic chemotherapy: Secondary | ICD-10-CM | POA: Diagnosis present

## 2019-04-30 DIAGNOSIS — G4700 Insomnia, unspecified: Secondary | ICD-10-CM | POA: Diagnosis not present

## 2019-04-30 DIAGNOSIS — Z5189 Encounter for other specified aftercare: Secondary | ICD-10-CM | POA: Diagnosis present

## 2019-04-30 DIAGNOSIS — Z7952 Long term (current) use of systemic steroids: Secondary | ICD-10-CM | POA: Diagnosis not present

## 2019-04-30 DIAGNOSIS — D701 Agranulocytosis secondary to cancer chemotherapy: Secondary | ICD-10-CM | POA: Diagnosis not present

## 2019-04-30 DIAGNOSIS — Z95828 Presence of other vascular implants and grafts: Secondary | ICD-10-CM

## 2019-04-30 LAB — CBC WITH DIFFERENTIAL/PLATELET
Abs Immature Granulocytes: 0.17 10*3/uL — ABNORMAL HIGH (ref 0.00–0.07)
Basophils Absolute: 0.1 10*3/uL (ref 0.0–0.1)
Basophils Relative: 1 %
Eosinophils Absolute: 0.2 10*3/uL (ref 0.0–0.5)
Eosinophils Relative: 2 %
HCT: 43.8 % (ref 39.0–52.0)
Hemoglobin: 15.2 g/dL (ref 13.0–17.0)
Immature Granulocytes: 2 %
Lymphocytes Relative: 18 %
Lymphs Abs: 1.3 10*3/uL (ref 0.7–4.0)
MCH: 32.3 pg (ref 26.0–34.0)
MCHC: 34.7 g/dL (ref 30.0–36.0)
MCV: 93 fL (ref 80.0–100.0)
Monocytes Absolute: 1 10*3/uL (ref 0.1–1.0)
Monocytes Relative: 13 %
Neutro Abs: 4.7 10*3/uL (ref 1.7–7.7)
Neutrophils Relative %: 64 %
Platelets: 275 10*3/uL (ref 150–400)
RBC: 4.71 MIL/uL (ref 4.22–5.81)
RDW: 13.4 % (ref 11.5–15.5)
WBC: 7.4 10*3/uL (ref 4.0–10.5)
nRBC: 0 % (ref 0.0–0.2)

## 2019-04-30 LAB — COMPREHENSIVE METABOLIC PANEL
ALT: 31 U/L (ref 0–44)
AST: 34 U/L (ref 15–41)
Albumin: 4.2 g/dL (ref 3.5–5.0)
Alkaline Phosphatase: 65 U/L (ref 38–126)
Anion gap: 11 (ref 5–15)
BUN: 11 mg/dL (ref 6–20)
CO2: 26 mmol/L (ref 22–32)
Calcium: 9.2 mg/dL (ref 8.9–10.3)
Chloride: 100 mmol/L (ref 98–111)
Creatinine, Ser: 0.87 mg/dL (ref 0.61–1.24)
GFR calc Af Amer: >60 mL/min (ref >60)
GFR calc non Af Amer: >60 mL/min (ref >60)
Glucose, Bld: 147 mg/dL — ABNORMAL HIGH (ref 70–99)
Potassium: 3.5 mmol/L (ref 3.5–5.1)
Sodium: 137 mmol/L (ref 135–145)
Total Bilirubin: 0.7 mg/dL (ref 0.3–1.2)
Total Protein: 7.2 g/dL (ref 6.5–8.1)

## 2019-04-30 MED ORDER — SODIUM CHLORIDE 0.9 % IV SOLN
150.0000 mg | Freq: Once | INTRAVENOUS | Status: AC
  Administered 2019-04-30: 150 mg via INTRAVENOUS
  Filled 2019-04-30: qty 5

## 2019-04-30 MED ORDER — HEPARIN SOD (PORK) LOCK FLUSH 100 UNIT/ML IV SOLN
INTRAVENOUS | Status: AC
  Filled 2019-04-30: qty 5

## 2019-04-30 MED ORDER — SODIUM CHLORIDE 0.9 % IV SOLN
Freq: Once | INTRAVENOUS | Status: AC
  Filled 2019-04-30: qty 250

## 2019-04-30 MED ORDER — HEPARIN SOD (PORK) LOCK FLUSH 100 UNIT/ML IV SOLN
500.0000 [IU] | Freq: Once | INTRAVENOUS | Status: AC | PRN
  Administered 2019-04-30: 500 [IU]
  Filled 2019-04-30: qty 5

## 2019-04-30 MED ORDER — SODIUM CHLORIDE 0.9 % IV SOLN
10.0000 [IU]/m2 | Freq: Once | INTRAVENOUS | Status: AC
  Administered 2019-04-30: 22 [IU] via INTRAVENOUS
  Filled 2019-04-30: qty 7.33

## 2019-04-30 MED ORDER — SODIUM CHLORIDE 0.9 % IV SOLN
10.0000 mg | Freq: Once | INTRAVENOUS | Status: AC
  Administered 2019-04-30: 10 mg via INTRAVENOUS
  Filled 2019-04-30: qty 10

## 2019-04-30 MED ORDER — DOXORUBICIN HCL CHEMO IV INJECTION 2 MG/ML
23.0000 mg/m2 | Freq: Once | INTRAVENOUS | Status: AC
  Administered 2019-04-30: 50 mg via INTRAVENOUS
  Filled 2019-04-30: qty 25

## 2019-04-30 MED ORDER — VINBLASTINE SULFATE CHEMO INJECTION 1 MG/ML
6.0000 mg/m2 | Freq: Once | INTRAVENOUS | Status: AC
  Administered 2019-04-30: 13.1 mg via INTRAVENOUS
  Filled 2019-04-30: qty 13.1

## 2019-04-30 MED ORDER — PALONOSETRON HCL INJECTION 0.25 MG/5ML
0.2500 mg | Freq: Once | INTRAVENOUS | Status: AC
  Administered 2019-04-30: 0.25 mg via INTRAVENOUS
  Filled 2019-04-30: qty 5

## 2019-04-30 MED ORDER — SODIUM CHLORIDE 0.9 % IV SOLN
365.0000 mg/m2 | Freq: Once | INTRAVENOUS | Status: AC
  Administered 2019-04-30: 800 mg via INTRAVENOUS
  Filled 2019-04-30: qty 80

## 2019-04-30 MED ORDER — SODIUM CHLORIDE 0.9% FLUSH
10.0000 mL | Freq: Once | INTRAVENOUS | Status: AC
  Administered 2019-04-30: 10 mL via INTRAVENOUS
  Filled 2019-04-30: qty 10

## 2019-04-30 NOTE — Progress Notes (Signed)
Patient does not offer any problems today.  

## 2019-04-30 NOTE — Progress Notes (Signed)
Pulse Rate: 115. MD, Dr. Grayland Ormond, notified and aware. Per MD order: proceed with scheduled ABVD treatment today.

## 2019-05-02 ENCOUNTER — Inpatient Hospital Stay: Payer: BC Managed Care – PPO

## 2019-05-02 ENCOUNTER — Other Ambulatory Visit: Payer: Self-pay

## 2019-05-02 ENCOUNTER — Encounter: Payer: Self-pay | Admitting: Oncology

## 2019-05-02 DIAGNOSIS — C8171 Other classical Hodgkin lymphoma, lymph nodes of head, face, and neck: Secondary | ICD-10-CM

## 2019-05-02 DIAGNOSIS — Z5189 Encounter for other specified aftercare: Secondary | ICD-10-CM | POA: Diagnosis not present

## 2019-05-02 MED ORDER — PEGFILGRASTIM-CBQV 6 MG/0.6ML ~~LOC~~ SOSY
6.0000 mg | PREFILLED_SYRINGE | Freq: Once | SUBCUTANEOUS | Status: AC
  Administered 2019-05-02: 14:00:00 6 mg via SUBCUTANEOUS
  Filled 2019-05-02: qty 0.6

## 2019-05-03 ENCOUNTER — Other Ambulatory Visit: Payer: Self-pay | Admitting: Emergency Medicine

## 2019-05-03 DIAGNOSIS — C8198 Hodgkin lymphoma, unspecified, lymph nodes of multiple sites: Secondary | ICD-10-CM

## 2019-05-03 MED ORDER — ONDANSETRON HCL 8 MG PO TABS: 8.0000 mg | ORAL_TABLET | Freq: Three times a day (TID) | ORAL | 2 refills | Status: DC | PRN

## 2019-05-10 NOTE — Progress Notes (Signed)
[facility]  [phone] [phone]  ID: Dale Hansen OB: 02-Aug-1995  MR#: [id number]  JME#:[id number]  Patient Care Team: Patient, No Pcp Per as PCP - General (General Practice)  CHIEF COMPLAINT: Stage IIa Hodgkin's lymphoma  INTERVAL HISTORY: Patient returns to clinic today for further evaluation and consideration of cycle 4, day 1 of ABVD.  He is tolerating his treatments well without significant side effects.  He currently feels well and is asymptomatic.  The lymphadenopathy in his neck is essentially resolved. He has no neurologic complaints.  He denies any recent fevers or illnesses.  He denies night sweats or unintentional weight loss.  He has no chest pain, shortness of breath, cough, or hemoptysis.  He denies any nausea, vomiting, constipation, or diarrhea.  He has no urinary complaints.  Patient offers no specific complaints today.  REVIEW OF SYSTEMS:   Review of Systems  Constitutional: Negative.  Negative for fever, malaise/fatigue and weight loss.  Respiratory: Negative.  Negative for cough, hemoptysis and shortness of breath.   Cardiovascular: Negative.  Negative for chest pain and leg swelling.  Gastrointestinal: Negative.  Negative for abdominal pain.  Genitourinary: Negative.   Musculoskeletal: Negative.  Negative for neck pain.  Skin: Negative.  Negative for rash.  Neurological: Negative.  Negative for dizziness, focal weakness, weakness and headaches.  Psychiatric/Behavioral: The patient has insomnia. The patient is not nervous/anxious.     As per HPI. Otherwise, a complete review of systems is negative.  PAST MEDICAL HISTORY: Past Medical History:  Diagnosis Date  . Attention deficit disorder (ADD)   . Seasonal allergies     PAST SURGICAL HISTORY: Negative.  FAMILY HISTORY: Family History  Problem Relation Age of Onset  . Cervical cancer Maternal Grandmother   . Non-Hodgkin's lymphoma Paternal Uncle     ADVANCED  DIRECTIVES (Y/N):  N  HEALTH MAINTENANCE: Social History   Tobacco Use  . Smoking status: Current Every Day Smoker    Packs/day: 0.25  . Smokeless tobacco: Never Used  Substance Use Topics  . Alcohol use: Yes    Comment: socially  . Drug use: Never     Colonoscopy:  PAP:  Bone density:  Lipid panel:  No Known Allergies  Current Outpatient Medications  Medication Sig Dispense Refill  . amphetamine-dextroamphetamine (ADDERALL) 20 MG tablet Take by mouth.    . lidocaine-prilocaine (EMLA) cream     . LORazepam (ATIVAN) 1 MG tablet Take 1 tablet (1 mg total) by mouth at bedtime as needed for anxiety. 30 tablet 0  . ondansetron (ZOFRAN) 8 MG tablet Take 1 tablet (8 mg total) by mouth every 8 (eight) hours as needed for nausea or vomiting. 30 tablet 2  . prochlorperazine (COMPAZINE) 10 MG tablet Take by mouth.    Marland Kitchen VYVANSE 60 MG capsule TK 1 C PO QD     No current facility-administered medications for this visit.    OBJECTIVE: Vitals:   05/14/19 1038  BP: 123/71  Pulse: (!) 120  Resp: 20     Body mass index is 24.83 kg/m.    ECOG FS:0 - Asymptomatic  General: Well-developed, well-nourished, no acute distress. Eyes: Pink conjunctiva, anicteric sclera. HEENT: Normocephalic, moist mucous membranes.  No palpable lymphadenopathy. Lungs: No audible wheezing or coughing. Heart: Regular rate and rhythm. Abdomen: Soft, nontender, no obvious distention. Musculoskeletal: No edema, cyanosis, or clubbing. Neuro: Alert, answering all questions appropriately. Cranial nerves grossly intact. Skin: No rashes or petechiae noted. Psych: Normal affect.  LAB RESULTS:  Lab  Results  Component Value Date   NA 137 05/14/2019   K 3.4 (L) 05/14/2019   CL 103 05/14/2019   CO2 25 05/14/2019   GLUCOSE 145 (H) 05/14/2019   BUN 7 05/14/2019   CREATININE 0.93 05/14/2019   CALCIUM 9.0 05/14/2019   PROT 6.9 05/14/2019   ALBUMIN 4.1 05/14/2019   AST 32 05/14/2019   ALT 30 05/14/2019    ALKPHOS 91 05/14/2019   BILITOT 0.5 05/14/2019   GFRNONAA >60 05/14/2019   GFRAA >60 05/14/2019    Lab Results  Component Value Date   WBC 8.1 05/14/2019   NEUTROABS PENDING 05/14/2019   HGB 15.3 05/14/2019   HCT 43.6 05/14/2019   MCV 94.6 05/14/2019   PLT 245 05/14/2019     STUDIES: No results found.  ASSESSMENT: Stage IIa Hodgkin's lymphoma, EBV positive.  PLAN:    1.  Hodgkin's lymphoma: CT scan on December 08, 2018 revealed a 6.2 cm nodal mass in the left neck.  Subsequent biopsy confirmed the diagnosis.  PET scan results from January 08, 2019 with bilateral neck lymphadenopathy and no other obvious evidence of disease confirming stage.  Bone marrow biopsy on January 19, 2019 revealed normal cellular marrow with trilineage hematopoiesis with no morphologic evidence of Hodgkin's lymphoma.  PFT and MUGA completed in [place] are reported as adequate to proceed with treatment.  Patient initiated cycle 1, day 1 of ABVD on February 07, 2019.  PET scan results from April 04, 2019 reviewed independently with significant improvement of patient's disease.  Per NCCN guidelines, will proceed with 2 cycles additional cycles of ABVD or 4 treatments and then repeat PET scan.  Given his history of neutropenia, patient will require a Udenyca for the remainder of his treatments.  Proceed with cycle 4, day 1 of ABVD.  Return to clinic's in 2 days for Udenyca only and then in 2 weeks for further evaluation and consideration of cycle 4, day 15.   2.  Covid positive: Resolved. 3.  Insomnia: Continue Ativan as needed.  Patient was given a refill today. 4.  Nausea: Patient does not complain of this today.  Patient was given a refill for downturn today. 5.  Neutropenia: Resolved.  Udenyca for the remainder of the cycles.   Patient expressed understanding and was in agreement with this plan. He also understands that He can call clinic at any time with any questions, concerns, or complaints.   Cancer  Staging Hodgkin lymphoma of lymph nodes of neck ([place]) Staging form: Hodgkin and Non-Hodgkin Lymphoma, AJCC 8th Edition - Clinical stage from 01/19/2019: Stage II (Hodgkin lymphoma, A - Asymptomatic) - Signed by Lloyd Huger, MD on 01/19/2019   Lloyd Huger, MD   05/14/2019 11:04 AM

## 2019-05-14 ENCOUNTER — Encounter: Payer: Self-pay | Admitting: Oncology

## 2019-05-14 ENCOUNTER — Inpatient Hospital Stay: Payer: BC Managed Care – PPO

## 2019-05-14 ENCOUNTER — Inpatient Hospital Stay (HOSPITAL_BASED_OUTPATIENT_CLINIC_OR_DEPARTMENT_OTHER): Payer: BC Managed Care – PPO | Admitting: Oncology

## 2019-05-14 VITALS — BP 123/71 | HR 120 | Resp 20 | Wt 204.0 lb

## 2019-05-14 VITALS — Temp 96.7°F

## 2019-05-14 DIAGNOSIS — C8198 Hodgkin lymphoma, unspecified, lymph nodes of multiple sites: Secondary | ICD-10-CM | POA: Diagnosis not present

## 2019-05-14 DIAGNOSIS — Z95828 Presence of other vascular implants and grafts: Secondary | ICD-10-CM

## 2019-05-14 DIAGNOSIS — Z5189 Encounter for other specified aftercare: Secondary | ICD-10-CM | POA: Diagnosis not present

## 2019-05-14 DIAGNOSIS — C8171 Other classical Hodgkin lymphoma, lymph nodes of head, face, and neck: Secondary | ICD-10-CM

## 2019-05-14 LAB — CBC WITH DIFFERENTIAL/PLATELET
Abs Immature Granulocytes: 1.09 10*3/uL — ABNORMAL HIGH (ref 0.00–0.07)
Basophils Absolute: 0 10*3/uL (ref 0.0–0.1)
Basophils Relative: 0 %
Eosinophils Absolute: 0.3 10*3/uL (ref 0.0–0.5)
Eosinophils Relative: 4 %
HCT: 43.6 % (ref 39.0–52.0)
Hemoglobin: 15.3 g/dL (ref 13.0–17.0)
Immature Granulocytes: 14 %
Lymphocytes Relative: 23 %
Lymphs Abs: 1.9 10*3/uL (ref 0.7–4.0)
MCH: 33.2 pg (ref 26.0–34.0)
MCHC: 35.1 g/dL (ref 30.0–36.0)
MCV: 94.6 fL (ref 80.0–100.0)
Monocytes Absolute: 0.6 10*3/uL (ref 0.1–1.0)
Monocytes Relative: 8 %
Neutro Abs: 4.2 10*3/uL (ref 1.7–7.7)
Neutrophils Relative %: 51 %
Platelets: 245 10*3/uL (ref 150–400)
RBC: 4.61 MIL/uL (ref 4.22–5.81)
RDW: 14 % (ref 11.5–15.5)
WBC: 8.1 10*3/uL (ref 4.0–10.5)
nRBC: 0 % (ref 0.0–0.2)

## 2019-05-14 LAB — COMPREHENSIVE METABOLIC PANEL
ALT: 30 U/L (ref 0–44)
AST: 32 U/L (ref 15–41)
Albumin: 4.1 g/dL (ref 3.5–5.0)
Alkaline Phosphatase: 91 U/L (ref 38–126)
Anion gap: 9 (ref 5–15)
BUN: 7 mg/dL (ref 6–20)
CO2: 25 mmol/L (ref 22–32)
Calcium: 9 mg/dL (ref 8.9–10.3)
Chloride: 103 mmol/L (ref 98–111)
Creatinine, Ser: 0.93 mg/dL (ref 0.61–1.24)
GFR calc Af Amer: >60 mL/min (ref >60)
GFR calc non Af Amer: >60 mL/min (ref >60)
Glucose, Bld: 145 mg/dL — ABNORMAL HIGH (ref 70–99)
Potassium: 3.4 mmol/L — ABNORMAL LOW (ref 3.5–5.1)
Sodium: 137 mmol/L (ref 135–145)
Total Bilirubin: 0.5 mg/dL (ref 0.3–1.2)
Total Protein: 6.9 g/dL (ref 6.5–8.1)

## 2019-05-14 MED ORDER — PALONOSETRON HCL INJECTION 0.25 MG/5ML
0.2500 mg | Freq: Once | INTRAVENOUS | Status: AC
  Administered 2019-05-14: 0.25 mg via INTRAVENOUS
  Filled 2019-05-14: qty 5

## 2019-05-14 MED ORDER — LORAZEPAM 1 MG PO TABS: 1.0000 mg | ORAL_TABLET | Freq: Every evening | ORAL | 0 refills | Status: DC | PRN

## 2019-05-14 MED ORDER — DOXORUBICIN HCL CHEMO IV INJECTION 2 MG/ML
23.0000 mg/m2 | Freq: Once | INTRAVENOUS | Status: AC
  Administered 2019-05-14: 50 mg via INTRAVENOUS
  Filled 2019-05-14: qty 25

## 2019-05-14 MED ORDER — VINBLASTINE SULFATE CHEMO INJECTION 1 MG/ML
6.0000 mg/m2 | Freq: Once | INTRAVENOUS | Status: AC
  Administered 2019-05-14: 13.1 mg via INTRAVENOUS
  Filled 2019-05-14: qty 13.1

## 2019-05-14 MED ORDER — SODIUM CHLORIDE 0.9 % IV SOLN
Freq: Once | INTRAVENOUS | Status: AC
  Filled 2019-05-14: qty 250

## 2019-05-14 MED ORDER — SODIUM CHLORIDE 0.9 % IV SOLN
150.0000 mg | Freq: Once | INTRAVENOUS | Status: AC
  Administered 2019-05-14: 150 mg via INTRAVENOUS
  Filled 2019-05-14: qty 150

## 2019-05-14 MED ORDER — SODIUM CHLORIDE 0.9 % IV SOLN
10.0000 mg | Freq: Once | INTRAVENOUS | Status: AC
  Administered 2019-05-14: 10 mg via INTRAVENOUS
  Filled 2019-05-14: qty 10

## 2019-05-14 MED ORDER — ONDANSETRON HCL 8 MG PO TABS: 8.0000 mg | ORAL_TABLET | Freq: Three times a day (TID) | ORAL | 2 refills | Status: AC | PRN

## 2019-05-14 MED ORDER — SODIUM CHLORIDE 0.9 % IV SOLN
10.0000 [IU]/m2 | Freq: Once | INTRAVENOUS | Status: AC
  Administered 2019-05-14: 22 [IU] via INTRAVENOUS
  Filled 2019-05-14: qty 7.33

## 2019-05-14 MED ORDER — SODIUM CHLORIDE 0.9 % IV SOLN
365.0000 mg/m2 | Freq: Once | INTRAVENOUS | Status: AC
  Administered 2019-05-14: 800 mg via INTRAVENOUS
  Filled 2019-05-14: qty 80

## 2019-05-14 MED ORDER — SODIUM CHLORIDE 0.9% FLUSH
10.0000 mL | Freq: Once | INTRAVENOUS | Status: AC
  Administered 2019-05-14: 10 mL via INTRAVENOUS
  Filled 2019-05-14: qty 10

## 2019-05-14 MED ORDER — HEPARIN SOD (PORK) LOCK FLUSH 100 UNIT/ML IV SOLN
500.0000 [IU] | Freq: Once | INTRAVENOUS | Status: AC | PRN
  Administered 2019-05-14: 500 [IU]
  Filled 2019-05-14: qty 5

## 2019-05-14 NOTE — Progress Notes (Signed)
Patient denies any concerns today.  

## 2019-05-14 NOTE — Progress Notes (Signed)
Per Argentina Donovan RN per Dr. Grayland Ormond okay to proceed with treatment with HR 120.  1248: Blood return noted before, every 3cc during and after Adriamycin push. Blood return noted before and after Velban infusion and blood return noted before and after Bleocin infusion.

## 2019-05-16 ENCOUNTER — Inpatient Hospital Stay: Payer: BC Managed Care – PPO

## 2019-05-16 DIAGNOSIS — C8171 Other classical Hodgkin lymphoma, lymph nodes of head, face, and neck: Secondary | ICD-10-CM

## 2019-05-16 DIAGNOSIS — Z5189 Encounter for other specified aftercare: Secondary | ICD-10-CM | POA: Diagnosis not present

## 2019-05-16 MED ORDER — PEGFILGRASTIM-CBQV 6 MG/0.6ML ~~LOC~~ SOSY
6.0000 mg | PREFILLED_SYRINGE | Freq: Once | SUBCUTANEOUS | Status: AC
  Administered 2019-05-16: 6 mg via SUBCUTANEOUS
  Filled 2019-05-16: qty 0.6

## 2019-05-26 NOTE — Progress Notes (Signed)
[place]  [phone] [phone]  ID: Dale Hansen OB: 18-Oct-1995  MR#: [id number]  RCB#:[id number]  Patient Care Team: Patient, No Pcp Per as PCP - General (General Practice)  CHIEF COMPLAINT: Stage IIa Hodgkin's lymphoma  INTERVAL HISTORY: Patient returns to clinic today for further evaluation and consideration of cycle 4, day 15 of ABVD.  He continues to tolerate his treatments well without significant side effects.  He currently feels well and is asymptomatic.  His neck lymphadenopathy has resolved.  He has no neurologic complaints.  He denies any recent fevers or illnesses.  He denies night sweats or unintentional weight loss.  He has no chest pain, shortness of breath, cough, or hemoptysis.  He denies any nausea, vomiting, constipation, or diarrhea.  He has no urinary complaints.  Patient offers no specific complaints today.  REVIEW OF SYSTEMS:   Review of Systems  Constitutional: Negative.  Negative for fever, malaise/fatigue and weight loss.  Respiratory: Negative.  Negative for cough, hemoptysis and shortness of breath.   Cardiovascular: Negative.  Negative for chest pain and leg swelling.  Gastrointestinal: Negative.  Negative for abdominal pain.  Genitourinary: Negative.   Musculoskeletal: Negative.  Negative for neck pain.  Skin: Negative.  Negative for rash.  Neurological: Negative.  Negative for dizziness, focal weakness, weakness and headaches.  Psychiatric/Behavioral: The patient has insomnia. The patient is not nervous/anxious.     As per HPI. Otherwise, a complete review of systems is negative.  PAST MEDICAL HISTORY: Past Medical History:  Diagnosis Date  . Attention deficit disorder (ADD)   . Seasonal allergies     PAST SURGICAL HISTORY: Negative.  FAMILY HISTORY: Family History  Problem Relation Age of Onset  . Cervical cancer Maternal Grandmother   . Non-Hodgkin's lymphoma Paternal Uncle     ADVANCED DIRECTIVES  (Y/N):  N  HEALTH MAINTENANCE: Social History   Tobacco Use  . Smoking status: Current Every Day Smoker    Packs/day: 0.25  . Smokeless tobacco: Never Used  Substance Use Topics  . Alcohol use: Yes    Comment: socially  . Drug use: Never     Colonoscopy:  PAP:  Bone density:  Lipid panel:  No Known Allergies  Current Outpatient Medications  Medication Sig Dispense Refill  . amphetamine-dextroamphetamine (ADDERALL) 20 MG tablet Take by mouth.    . lidocaine-prilocaine (EMLA) cream     . LORazepam (ATIVAN) 1 MG tablet Take 1 tablet (1 mg total) by mouth at bedtime as needed for anxiety. 30 tablet 0  . ondansetron (ZOFRAN) 8 MG tablet Take 1 tablet (8 mg total) by mouth every 8 (eight) hours as needed for nausea or vomiting. 60 tablet 2  . prochlorperazine (COMPAZINE) 10 MG tablet Take by mouth.    Marland Kitchen VYVANSE 60 MG capsule TK 1 C PO QD     No current facility-administered medications for this visit.   Facility-Administered Medications Ordered in Other Visits  Medication Dose Route Frequency Provider Last Rate Last Admin  . bleomycin (BLEOCIN) 22 Units in sodium chloride 0.9 % 50 mL chemo infusion  10 Units/m2 (Treatment Plan Recorded) Intravenous Once Lloyd Huger, MD      . dacarbazine (DTIC) 800 mg in sodium chloride 0.9 % 250 mL chemo infusion  365 mg/m2 (Treatment Plan Recorded) Intravenous Once Lloyd Huger, MD      . dexamethasone (DECADRON) 10 mg in sodium chloride 0.9 % 50 mL IVPB  10 mg Intravenous Once Lloyd Huger, MD 204 mL/hr  at 05/28/19 1154 10 mg at 05/28/19 1154  . DOXOrubicin (ADRIAMYCIN) chemo injection 50 mg  50 mg Intravenous Once Lloyd Huger, MD      . fosaprepitant (EMEND) 150 mg in sodium chloride 0.9 % 145 mL IVPB  150 mg Intravenous Once Lloyd Huger, MD      . heparin lock flush 100 unit/mL  500 Units Intracatheter Once PRN Lloyd Huger, MD      . sodium chloride flush (NS) 0.9 % injection 10 mL  10 mL  Intravenous Once Lloyd Huger, MD      . vinBLAStine (VELBAN) 13.1 mg in sodium chloride 0.9 % 50 mL chemo infusion  6 mg/m2 (Treatment Plan Recorded) Intravenous Once Lloyd Huger, MD        OBJECTIVE: Vitals:   05/28/19 1028  BP: 133/75  Pulse: (!) 105  Resp: 20  Temp: (!) 96.6 F (35.9 C)  SpO2: 100%     Body mass index is 24.97 kg/m.    ECOG FS:0 - Asymptomatic  General: Well-developed, well-nourished, no acute distress. Eyes: Pink conjunctiva, anicteric sclera. HEENT: Normocephalic, moist mucous membranes. Lungs: No audible wheezing or coughing. Heart: Regular rate and rhythm. Abdomen: Soft, nontender, no obvious distention. Musculoskeletal: No edema, cyanosis, or clubbing. Neuro: Alert, answering all questions appropriately. Cranial nerves grossly intact. Skin: No rashes or petechiae noted. Psych: Normal affect.   LAB RESULTS:  Lab Results  Component Value Date   NA 133 (L) 05/28/2019   K 3.4 (L) 05/28/2019   CL 100 05/28/2019   CO2 25 05/28/2019   GLUCOSE 147 (H) 05/28/2019   BUN 13 05/28/2019   CREATININE 0.90 05/28/2019   CALCIUM 8.9 05/28/2019   PROT 6.9 05/28/2019   ALBUMIN 4.1 05/28/2019   AST 49 (H) 05/28/2019   ALT 34 05/28/2019   ALKPHOS 101 05/28/2019   BILITOT 0.5 05/28/2019   GFRNONAA >60 05/28/2019   GFRAA >60 05/28/2019    Lab Results  Component Value Date   WBC 10.7 (H) 05/28/2019   NEUTROABS 6.6 05/28/2019   HGB 14.5 05/28/2019   HCT 40.9 05/28/2019   MCV 94.0 05/28/2019   PLT 329 05/28/2019     STUDIES: No results found.  ASSESSMENT: Stage IIa Hodgkin's lymphoma, EBV positive.  PLAN:    1.  Hodgkin's lymphoma: CT scan on December 08, 2018 revealed a 6.2 cm nodal mass in the left neck.  Subsequent biopsy confirmed the diagnosis.  PET scan results from January 08, 2019 with bilateral neck lymphadenopathy and no other obvious evidence of disease confirming stage.  Bone marrow biopsy on January 19, 2019 revealed  normal cellular marrow with trilineage hematopoiesis with no morphologic evidence of Hodgkin's lymphoma.  PFT and MUGA completed in [place] are reported as adequate to proceed with treatment.  Patient initiated cycle 1, day 1 of ABVD on February 07, 2019.  PET scan results from April 04, 2019 reviewed independently with significant improvement of patient's disease.  Per NCCN guidelines, will proceed with 2 cycles additional cycles of ABVD or 4 treatments and then repeat PET scan.  Given his history of neutropenia, patient will require a Udenyca for the remainder of his treatments.  Proceed with cycle 4, day 15 of ABVD today.  Return to clinic in 2 days for [facility].  Patient will then have a PET scan and video assisted telemedicine visit in 2 weeks for further evaluation.  He reports he is likely leaving [place] by the end of May.  2.  Covid positive: Resolved. 3.  Insomnia: Continue Ativan as needed.  4.  Nausea: Patient does not complain of this today.  5.  Neutropenia: Resolved.  Udenyca for the remainder of the cycles.  I spent a total of 30 minutes reviewing chart data, face-to-face evaluation with the patient, counseling and coordination of care as detailed above.   Patient expressed understanding and was in agreement with this plan. He also understands that He can call clinic at any time with any questions, concerns, or complaints.   Cancer Staging Hodgkin lymphoma of lymph nodes of neck ([place]) Staging form: Hodgkin and Non-Hodgkin Lymphoma, AJCC 8th Edition - Clinical stage from 01/19/2019: Stage II (Hodgkin lymphoma, A - Asymptomatic) - Signed by Lloyd Huger, MD on 01/19/2019   Lloyd Huger, MD   05/28/2019 12:03 PM

## 2019-05-28 ENCOUNTER — Inpatient Hospital Stay: Payer: BC Managed Care – PPO

## 2019-05-28 ENCOUNTER — Encounter: Payer: Self-pay | Admitting: Oncology

## 2019-05-28 ENCOUNTER — Inpatient Hospital Stay (HOSPITAL_BASED_OUTPATIENT_CLINIC_OR_DEPARTMENT_OTHER): Payer: BC Managed Care – PPO | Admitting: Oncology

## 2019-05-28 VITALS — HR 105

## 2019-05-28 VITALS — BP 133/75 | HR 105 | Temp 96.6°F | Resp 20 | Wt 205.1 lb

## 2019-05-28 DIAGNOSIS — C8171 Other classical Hodgkin lymphoma, lymph nodes of head, face, and neck: Secondary | ICD-10-CM

## 2019-05-28 DIAGNOSIS — Z5189 Encounter for other specified aftercare: Secondary | ICD-10-CM | POA: Diagnosis not present

## 2019-05-28 LAB — CBC WITH DIFFERENTIAL/PLATELET
Abs Immature Granulocytes: 1.11 10*3/uL — ABNORMAL HIGH (ref 0.00–0.07)
Basophils Absolute: 0.2 10*3/uL — ABNORMAL HIGH (ref 0.0–0.1)
Basophils Relative: 2 %
Eosinophils Absolute: 0.2 10*3/uL (ref 0.0–0.5)
Eosinophils Relative: 2 %
HCT: 40.9 % (ref 39.0–52.0)
Hemoglobin: 14.5 g/dL (ref 13.0–17.0)
Immature Granulocytes: 10 %
Lymphocytes Relative: 16 %
Lymphs Abs: 1.8 10*3/uL (ref 0.7–4.0)
MCH: 33.3 pg (ref 26.0–34.0)
MCHC: 35.5 g/dL (ref 30.0–36.0)
MCV: 94 fL (ref 80.0–100.0)
Monocytes Absolute: 0.8 10*3/uL (ref 0.1–1.0)
Monocytes Relative: 7 %
Neutro Abs: 6.6 10*3/uL (ref 1.7–7.7)
Neutrophils Relative %: 63 %
Platelets: 329 10*3/uL (ref 150–400)
RBC: 4.35 MIL/uL (ref 4.22–5.81)
RDW: 13.7 % (ref 11.5–15.5)
WBC: 10.7 10*3/uL — ABNORMAL HIGH (ref 4.0–10.5)
nRBC: 0 % (ref 0.0–0.2)

## 2019-05-28 LAB — COMPREHENSIVE METABOLIC PANEL
ALT: 34 U/L (ref 0–44)
AST: 49 U/L — ABNORMAL HIGH (ref 15–41)
Albumin: 4.1 g/dL (ref 3.5–5.0)
Alkaline Phosphatase: 101 U/L (ref 38–126)
Anion gap: 8 (ref 5–15)
BUN: 13 mg/dL (ref 6–20)
CO2: 25 mmol/L (ref 22–32)
Calcium: 8.9 mg/dL (ref 8.9–10.3)
Chloride: 100 mmol/L (ref 98–111)
Creatinine, Ser: 0.9 mg/dL (ref 0.61–1.24)
GFR calc Af Amer: >60 mL/min (ref >60)
GFR calc non Af Amer: >60 mL/min (ref >60)
Glucose, Bld: 147 mg/dL — ABNORMAL HIGH (ref 70–99)
Potassium: 3.4 mmol/L — ABNORMAL LOW (ref 3.5–5.1)
Sodium: 133 mmol/L — ABNORMAL LOW (ref 135–145)
Total Bilirubin: 0.5 mg/dL (ref 0.3–1.2)
Total Protein: 6.9 g/dL (ref 6.5–8.1)

## 2019-05-28 MED ORDER — DOXORUBICIN HCL CHEMO IV INJECTION 2 MG/ML: 25.0000 mg/m2 | Freq: Once | INTRAVENOUS | Status: DC

## 2019-05-28 MED ORDER — VINBLASTINE SULFATE CHEMO INJECTION 1 MG/ML
6.0000 mg/m2 | Freq: Once | INTRAVENOUS | Status: AC
  Administered 2019-05-28: 13.1 mg via INTRAVENOUS
  Filled 2019-05-28: qty 13.1

## 2019-05-28 MED ORDER — PALONOSETRON HCL INJECTION 0.25 MG/5ML
0.2500 mg | Freq: Once | INTRAVENOUS | Status: AC
  Administered 2019-05-28: 0.25 mg via INTRAVENOUS
  Filled 2019-05-28: qty 5

## 2019-05-28 MED ORDER — HEPARIN SOD (PORK) LOCK FLUSH 100 UNIT/ML IV SOLN
INTRAVENOUS | Status: AC
  Filled 2019-05-28: qty 5

## 2019-05-28 MED ORDER — DOXORUBICIN HCL CHEMO IV INJECTION 2 MG/ML
50.0000 mg | Freq: Once | INTRAVENOUS | Status: AC
  Administered 2019-05-28: 50 mg via INTRAVENOUS
  Filled 2019-05-28: qty 25

## 2019-05-28 MED ORDER — HEPARIN SOD (PORK) LOCK FLUSH 100 UNIT/ML IV SOLN
500.0000 [IU] | Freq: Once | INTRAVENOUS | Status: AC | PRN
  Administered 2019-05-28: 500 [IU]
  Filled 2019-05-28: qty 5

## 2019-05-28 MED ORDER — SODIUM CHLORIDE 0.9 % IV SOLN
Freq: Once | INTRAVENOUS | Status: AC
  Filled 2019-05-28: qty 250

## 2019-05-28 MED ORDER — SODIUM CHLORIDE 0.9 % IV SOLN
10.0000 [IU]/m2 | Freq: Once | INTRAVENOUS | Status: AC
  Administered 2019-05-28: 22 [IU] via INTRAVENOUS
  Filled 2019-05-28: qty 7.33

## 2019-05-28 MED ORDER — SODIUM CHLORIDE 0.9 % IV SOLN
150.0000 mg | Freq: Once | INTRAVENOUS | Status: AC
  Administered 2019-05-28: 150 mg via INTRAVENOUS
  Filled 2019-05-28: qty 150

## 2019-05-28 MED ORDER — SODIUM CHLORIDE 0.9 % IV SOLN
10.0000 mg | Freq: Once | INTRAVENOUS | Status: AC
  Administered 2019-05-28: 10 mg via INTRAVENOUS
  Filled 2019-05-28: qty 10

## 2019-05-28 MED ORDER — SODIUM CHLORIDE 0.9% FLUSH
10.0000 mL | Freq: Once | INTRAVENOUS | Status: AC
  Administered 2019-05-28: 10 mL via INTRAVENOUS
  Filled 2019-05-28: qty 10

## 2019-05-28 MED ORDER — SODIUM CHLORIDE 0.9 % IV SOLN
365.0000 mg/m2 | Freq: Once | INTRAVENOUS | Status: AC
  Administered 2019-05-28: 800 mg via INTRAVENOUS
  Filled 2019-05-28: qty 80

## 2019-05-28 NOTE — Progress Notes (Signed)
Per MD to continue with treatment with pt.'s HR-105. Treatment team and pt updated.   Dale Hansen [employer]

## 2019-05-30 ENCOUNTER — Inpatient Hospital Stay: Payer: BC Managed Care – PPO

## 2019-05-30 ENCOUNTER — Other Ambulatory Visit: Payer: Self-pay

## 2019-05-30 DIAGNOSIS — Z5189 Encounter for other specified aftercare: Secondary | ICD-10-CM | POA: Diagnosis not present

## 2019-05-30 DIAGNOSIS — C8171 Other classical Hodgkin lymphoma, lymph nodes of head, face, and neck: Secondary | ICD-10-CM

## 2019-05-30 MED ORDER — PEGFILGRASTIM-CBQV 6 MG/0.6ML ~~LOC~~ SOSY
6.0000 mg | PREFILLED_SYRINGE | Freq: Once | SUBCUTANEOUS | Status: AC
  Administered 2019-05-30: 6 mg via SUBCUTANEOUS

## 2019-06-04 ENCOUNTER — Telehealth: Payer: Self-pay | Admitting: *Deleted

## 2019-06-04 NOTE — Telephone Encounter (Signed)
Woodfin Ganja, are you going to call her?

## 2019-06-04 NOTE — Telephone Encounter (Signed)
Waiting to hear back from is lymphoma Doc in [place].  She's calling them too.

## 2019-06-04 NOTE — Telephone Encounter (Signed)
Patient mother Almyra Free called asking for a return call to discuss the upcoming PET scan scheduled for Wednesday. Please return her call [phone]

## 2019-06-06 ENCOUNTER — Ambulatory Visit
Admission: RE | Admit: 2019-06-06 | Discharge: 2019-06-06 | Disposition: A | Discharge status: Home / Self Care | Payer: BC Managed Care – PPO | Source: Ambulatory Visit | Attending: Oncology | Admitting: Oncology

## 2019-06-06 ENCOUNTER — Other Ambulatory Visit: Payer: Self-pay

## 2019-06-06 DIAGNOSIS — C8171 Other classical Hodgkin lymphoma, lymph nodes of head, face, and neck: Secondary | ICD-10-CM | POA: Diagnosis not present

## 2019-06-06 LAB — GLUCOSE, CAPILLARY: Glucose-Capillary: 84 mg/dL (ref 70–99)

## 2019-06-06 MED ORDER — FLUDEOXYGLUCOSE F - 18 (FDG) INJECTION
10.6000 | Freq: Once | INTRAVENOUS | Status: AC | PRN
  Administered 2019-06-06: 12:00:00 11.1 via INTRAVENOUS

## 2019-06-08 NOTE — Progress Notes (Signed)
Dale Hansen  Telephone:(336) [phone] Fax:(336) [phone]  ID: Dale Hansen OB: 1995/07/17  MR#: [id number]  XMI#:[id number]  Patient Care Team: Patient, No Pcp Per as PCP - General (General Practice)  I connected with Dale Hansen on 06/13/19 at  2:00 PM EDT by video enabled telemedicine visit and verified that I am speaking with the correct person using two identifiers.   I discussed the limitations, risks, security and privacy concerns of performing an evaluation and management service by telemedicine and the availability of in-person appointments. I also discussed with the patient that there may be a patient responsible charge related to this service. The patient expressed understanding and agreed to proceed.   Other persons participating in the visit and their role in the encounter: Patient, MD.  Patients location: Home. Providers location: Clinic.  CHIEF COMPLAINT: Stage IIa Hodgkin's lymphoma  INTERVAL HISTORY: Patient agreed to video assisted telemedicine visit for further evaluation and discussion of his PET scan results.  He currently feels well and is asymptomatic. His neck lymphadenopathy has resolved.  He has no neurologic complaints.  He denies any recent fevers or illnesses.  He denies night sweats or unintentional weight loss.  He has no chest pain, shortness of breath, cough, or hemoptysis.  He denies any nausea, vomiting, constipation, or diarrhea.  He has no urinary complaints.  Patient offers no specific complaints today.  REVIEW OF SYSTEMS:   Review of Systems  Constitutional: Negative.  Negative for fever, malaise/fatigue and weight loss.  Respiratory: Negative.  Negative for cough, hemoptysis and shortness of breath.   Cardiovascular: Negative.  Negative for chest pain and leg swelling.  Gastrointestinal: Negative.  Negative for abdominal pain.  Genitourinary: Negative.   Musculoskeletal: Negative.  Negative for neck pain.  Skin: Negative.   Negative for rash.  Neurological: Negative.  Negative for dizziness, focal weakness, weakness and headaches.  Psychiatric/Behavioral: The patient has insomnia. The patient is not nervous/anxious.     As per HPI. Otherwise, a complete review of systems is negative.  PAST MEDICAL HISTORY: Past Medical History:  Diagnosis Date   Attention deficit disorder (ADD)    Seasonal allergies     PAST SURGICAL HISTORY: Negative.  FAMILY HISTORY: Family History  Problem Relation Age of Onset   Cervical cancer Maternal Grandmother    Non-Hodgkin's lymphoma Paternal Uncle     ADVANCED DIRECTIVES (Y/N):  N  HEALTH MAINTENANCE: Social History   Tobacco Use   Smoking status: Current Every Day Smoker    Packs/day: 0.25   Smokeless tobacco: Never Used  Substance Use Topics   Alcohol use: Yes    Comment: socially   Drug use: Never     Colonoscopy:  PAP:  Bone density:  Lipid panel:  No Known Allergies  Current Outpatient Medications  Medication Sig Dispense Refill   amphetamine-dextroamphetamine (ADDERALL) 20 MG tablet Take by mouth.     lidocaine-prilocaine (EMLA) cream      LORazepam (ATIVAN) 1 MG tablet Take 1 tablet (1 mg total) by mouth at bedtime as needed for anxiety. 30 tablet 0   ondansetron (ZOFRAN) 8 MG tablet Take 1 tablet (8 mg total) by mouth every 8 (eight) hours as needed for nausea or vomiting. 60 tablet 2   prochlorperazine (COMPAZINE) 10 MG tablet Take by mouth.     VYVANSE 60 MG capsule TK 1 C PO QD     No current facility-administered medications for this visit.    OBJECTIVE: There were no vitals filed for this  visit.   There is no height or weight on file to calculate BMI.    ECOG FS:0 - Asymptomatic  General: Well-developed, well-nourished, no acute distress. HEENT: Normocephalic. Neuro: Alert, answering all questions appropriately. Cranial nerves grossly intact. Psych: Normal affect.   LAB RESULTS:  Lab Results  Component Value Date    NA 133 (L) 05/28/2019   K 3.4 (L) 05/28/2019   CL 100 05/28/2019   CO2 25 05/28/2019   GLUCOSE 147 (H) 05/28/2019   BUN 13 05/28/2019   CREATININE 0.90 05/28/2019   CALCIUM 8.9 05/28/2019   PROT 6.9 05/28/2019   ALBUMIN 4.1 05/28/2019   AST 49 (H) 05/28/2019   ALT 34 05/28/2019   ALKPHOS 101 05/28/2019   BILITOT 0.5 05/28/2019   GFRNONAA >60 05/28/2019   GFRAA >60 05/28/2019    Lab Results  Component Value Date   WBC 10.7 (H) 05/28/2019   NEUTROABS 6.6 05/28/2019   HGB 14.5 05/28/2019   HCT 40.9 05/28/2019   MCV 94.0 05/28/2019   PLT 329 05/28/2019     STUDIES: NM PET Image Restag (PS) Skull Base To Thigh  Result Date: 06/06/2019 CLINICAL DATA:  Subsequent treatment strategy for Hodgkin's lymphoma. EXAM: NUCLEAR MEDICINE PET SKULL BASE TO THIGH TECHNIQUE: 11.1 mCi F-18 FDG was injected intravenously. Full-ring PET imaging was performed from the skull base to thigh after the radiotracer. CT data was obtained and used for attenuation correction and anatomic localization. Fasting blood glucose: 84 mg/dl COMPARISON:  04/04/2019 FINDINGS: Mediastinal blood pool activity: SUV max 2.1 Liver activity: SUV max 3.1 NECK: Right-sided level 2 node measures 9 mm and a S.U.V. max of 2.3 on 44/3. Compare 11 mm and a S.U.V. max of 3.8 on the prior exam. A low left jugular/supraclavicular node measures 5 mm and a S.U.V. max of 1.8 today versus 6 mm and a S.U.V. max of 1.9 on the prior. No new hypermetabolic nodes within the neck. Incidental CT findings: The previously described left-sided level 2 node is no longer hypermetabolic and measures 8 mm on 50/3. Compare 11 mm on the prior. CHEST: No pulmonary parenchymal or thoracic nodal hypermetabolism. Incidental CT findings: Right Port-A-Cath tip at high to mid right atrium. No thoracic adenopathy. ABDOMEN/PELVIS: No abdominopelvic parenchymal or nodal hypermetabolism. Incidental CT findings: Normal adrenal glands. Probable hepatic steatosis. No  abdominopelvic adenopathy. SKELETON: Diffuse marrow hypermetabolism is likely due to stimulation by chemotherapy. Incidental CT findings: No focal osseous lesion. IMPRESSION: 1. Response to therapy of cervical nodal disease.  ([place]) 3 2. No new sites of disease identified. 3. Diffuse marrow hypermetabolism, likely due to stimulation by chemotherapy. Electronically Signed   By: Abigail Miyamoto M.D.   On: 06/06/2019 15:44    ONCOLOGY HISTORY: CT scan on December 08, 2018 revealed a 6.2 cm nodal mass in the left neck.  Subsequent biopsy confirmed the diagnosis.  PET scan results from January 08, 2019 with bilateral neck lymphadenopathy and no other obvious evidence of disease confirming stage.  Bone marrow biopsy on January 19, 2019 revealed normal cellular marrow with trilineage hematopoiesis with no morphologic evidence of Hodgkin's lymphoma.  PFT and MUGA completed in [place] are reported as adequate to proceed with treatment.  Patient initiated cycle 1, day 1 of ABVD on February 07, 2019.  PET scan results from April 04, 2019 reviewed independently with significant improvement of patient's disease.  Per NCCN guidelines, he then received 2 cycles additional cycles of ABVD.  ASSESSMENT: Stage IIa Hodgkin's lymphoma, EBV positive.  PLAN:  1.  Hodgkin's lymphoma: Patient receives his last infusion of ABVD on May 28, 2019.  PET scan results from June 06, 2019 reviewed independently and reported as above with significant improvement and near resolution of his known lymphadenopathy.  Patient does not require any additional chemotherapy.  Referral was given to radiation oncology for consideration of adjuvant XRT.  Patient reports he is leaving [place] permanently at the end of May.  No further follow-up has been scheduled, patient will have all of his surveillance imaging and evaluation at [facility] in [place].   2.  Covid positive: Resolved. 3.  Insomnia: Continue Ativan as needed.  4.  Nausea:  Patient does not complain of this today.  5.  Neutropenia: Resolved.   I provided 30 minutes of face-to-face video visit time during this encounter which included chart review, counseling, and coordination of care as documented above.   Patient expressed understanding and was in agreement with this plan. He also understands that He can call clinic at any time with any questions, concerns, or complaints.   Cancer Staging Hodgkin lymphoma of lymph nodes of neck ([place]) Staging form: Hodgkin and Non-Hodgkin Lymphoma, AJCC 8th Edition - Clinical stage from 01/19/2019: Stage II (Hodgkin lymphoma, A - Asymptomatic) - Signed by Lloyd Huger, MD on 01/19/2019   Lloyd Huger, MD   06/13/2019 6:36 AM

## 2019-06-12 ENCOUNTER — Inpatient Hospital Stay: Payer: BC Managed Care – PPO | Attending: Oncology | Admitting: Oncology

## 2019-06-12 ENCOUNTER — Encounter: Payer: Self-pay | Admitting: Oncology

## 2019-06-12 DIAGNOSIS — B2709 Gammaherpesviral mononucleosis with other complications: Secondary | ICD-10-CM

## 2019-06-12 DIAGNOSIS — C8171 Other classical Hodgkin lymphoma, lymph nodes of head, face, and neck: Secondary | ICD-10-CM | POA: Diagnosis not present

## 2019-06-12 DIAGNOSIS — F17210 Nicotine dependence, cigarettes, uncomplicated: Secondary | ICD-10-CM | POA: Diagnosis not present

## 2019-06-12 DIAGNOSIS — Z8616 Personal history of COVID-19: Secondary | ICD-10-CM

## 2019-06-12 DIAGNOSIS — Z79899 Other long term (current) drug therapy: Secondary | ICD-10-CM

## 2019-06-12 DIAGNOSIS — G4700 Insomnia, unspecified: Secondary | ICD-10-CM

## 2019-06-13 ENCOUNTER — Other Ambulatory Visit: Payer: Self-pay

## 2019-06-13 ENCOUNTER — Encounter: Payer: Self-pay | Admitting: Radiation Oncology

## 2019-06-13 ENCOUNTER — Ambulatory Visit
Admission: RE | Admit: 2019-06-13 | Discharge: 2019-06-13 | Disposition: A | Discharge status: Home / Self Care | Payer: BC Managed Care – PPO | Source: Ambulatory Visit | Attending: Radiation Oncology | Admitting: Radiation Oncology

## 2019-06-13 DIAGNOSIS — Z79899 Other long term (current) drug therapy: Secondary | ICD-10-CM | POA: Diagnosis not present

## 2019-06-13 DIAGNOSIS — F17210 Nicotine dependence, cigarettes, uncomplicated: Secondary | ICD-10-CM | POA: Insufficient documentation

## 2019-06-13 DIAGNOSIS — Z9221 Personal history of antineoplastic chemotherapy: Secondary | ICD-10-CM | POA: Diagnosis not present

## 2019-06-13 DIAGNOSIS — C8171 Other classical Hodgkin lymphoma, lymph nodes of head, face, and neck: Secondary | ICD-10-CM

## 2019-06-13 DIAGNOSIS — F988 Other specified behavioral and emotional disorders with onset usually occurring in childhood and adolescence: Secondary | ICD-10-CM | POA: Diagnosis not present

## 2019-06-13 NOTE — Consult Note (Signed)
NEW PATIENT EVALUATION  Name: Dale Hansen  MRN: [id number]  Date:   06/13/2019     DOB: 03-Feb-1996   This 24 y.o. male patient presents to the clinic for initial evaluation of stage IIa Hodgkin's lymphoma status post 4 cycles of ABVD chemotherapy with complete response by PET/CT criteria.  REFERRING PHYSICIAN: Lloyd Huger, MD  CHIEF COMPLAINT:  Chief Complaint  Patient presents with  . Cancer    Initial consult    DIAGNOSIS: The encounter diagnosis was Other classical Hodgkin lymphoma of lymph nodes of neck ([place]).   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  He underwent PET CT scan initially showing hypermetabolic 6.2 cm mass in the left neck.  He had bilateral Duvall 5 adenopathy in the neck with hypermetabolic activity.  Also both palatine tonsils showed hypermetabolic activity.  CT-guided core biopsy of his lymph node showed classic Hodgkin's lymphoma EBV positive.  Bone marrow was normal.  Patient had no constitutional symptoms no fevers chills or night sweats.  Patient has undergone 4 cycles of ABVD chemotherapy which she is tolerated fairly well.  Most recent PET CT scan shows almost complete resolution of all hypermetabolic adenopathy in the head and neck region.  He specifically denies fever chills night sweats weight loss.  He is seen today for consideration of involved field radiation therapy.  PLANNED TREATMENT REGIMEN: Involved field radiation therapy  PAST MEDICAL HISTORY:  has a past medical history of Attention deficit disorder (ADD) and Seasonal allergies.    PAST SURGICAL HISTORY:  Past Surgical History:  Procedure Laterality Date  . SKIN GRAFT Right    right middle finger    FAMILY HISTORY: family history includes Cervical cancer in his maternal grandmother;  Non-Hodgkin's lymphoma in his paternal uncle.  SOCIAL HISTORY:  reports that he has been smoking. He has been smoking about 0.25 packs per day. He has never used smokeless tobacco. He reports current alcohol use. He reports that he does not use drugs.  ALLERGIES: Patient has no known allergies.  MEDICATIONS:  Current Outpatient Medications  Medication Sig Dispense Refill  . amphetamine-dextroamphetamine (ADDERALL) 20 MG tablet Take by mouth.    . lidocaine-prilocaine (EMLA) cream     . LORazepam (ATIVAN) 1 MG tablet Take 1 tablet (1 mg total) by mouth at bedtime as needed for anxiety. 30 tablet 0  . ondansetron (ZOFRAN) 8 MG tablet Take 1 tablet (8 mg total) by mouth every 8 (eight) hours as needed for nausea or vomiting. 60 tablet 2  . prochlorperazine (COMPAZINE) 10 MG tablet Take by mouth.    Marland Kitchen VYVANSE 60 MG capsule TK 1 C PO QD     No current facility-administered medications for this encounter.    ECOG PERFORMANCE STATUS:  0 - Asymptomatic  REVIEW OF SYSTEMS: Patient denies any weight loss, fatigue, weakness, fever, chills or night sweats. Patient denies any loss of vision, blurred vision. Patient denies any ringing  of the ears or hearing loss. No irregular heartbeat. Patient denies heart murmur or history of fainting. Patient denies any chest pain or pain radiating to her upper extremities. Patient denies any shortness of breath, difficulty breathing at night, cough or hemoptysis. Patient denies any swelling in the lower legs. Patient denies any nausea vomiting, vomiting of blood, or coffee ground material in the vomitus. Patient denies any stomach  pain. Patient states has had normal bowel movements no significant constipation or diarrhea. Patient denies any dysuria, hematuria or significant nocturia. Patient denies any problems walking, swelling in the joints or loss of balance. Patient denies any skin changes, loss of hair or loss of weight. Patient denies any excessive worrying or  anxiety or significant depression. Patient denies any problems with insomnia. Patient denies excessive thirst, polyuria, polydipsia. Patient denies any swollen glands, patient denies easy bruising or easy bleeding. Patient denies any recent infections, allergies or URI. Patient "s visual fields have not changed significantly in recent time.   PHYSICAL EXAM: BP (P) 133/75 (BP Location: Left Arm, Patient Position: Sitting, Cuff Size: Normal)   Pulse (!) (P) 109   Temp (!) (P) 96.7 F (35.9 C) (Tympanic)   Wt (P) 200 lb 6.4 oz (90.9 kg)   BMI (P) 24.39 kg/m  Still has some slight persistent adenopathy in the left mid cervical chain.  Well-developed well-nourished patient in NAD. HEENT reveals PERLA, EOMI, discs not visualized.  Oral cavity is clear. No oral mucosal lesions are identified. Neck is clear without evidence of cervical or supraclavicular adenopathy. Lungs are clear to A&P. Cardiac examination is essentially unremarkable with regular rate and rhythm without murmur rub or thrill. Abdomen is benign with no organomegaly or masses noted. Motor sensory and DTR levels are equal and symmetric in the upper and lower extremities. Cranial nerves II through XII are grossly intact. Proprioception is intact. No peripheral adenopathy or edema is identified. No motor or sensory levels are noted. Crude visual fields are within normal range.  LABORATORY DATA: Pathology report reviewed    RADIOLOGY RESULTS: Serial PET CT scans reviewed   IMPRESSION: Stage IIa classic Hodgkin's lymphoma in this 24 year old male status post 4 cycles of ABVD chemotherapy with complete response by PET/CT criteria for involved field radiation therapy  PLAN: At this time like to go ahead with involved field radiation therapy to the head and neck region.  I would treat with 3000 cGy over 3 weeks using IMRT treatment planning and delivery.  I would choose IMRT treatment planning delivery to spare critical structures such as  salivary glands spinal cord esophagus.  Risks and benefits of treatment including alteration of taste slight dryness of the mouth fatigue skin reaction alteration of blood counts all were discussed in detail.  I have personally set up and ordered CT simulation for tomorrow.  Patient comprehends my treatment plan well.  I would like to take this opportunity to thank you for allowing me to participate in the care of your patient.Noreene Filbert, MD

## 2019-06-14 ENCOUNTER — Ambulatory Visit
Admission: RE | Admit: 2019-06-14 | Discharge: 2019-06-14 | Disposition: A | Discharge status: Home / Self Care | Payer: BC Managed Care – PPO | Source: Ambulatory Visit | Attending: Radiation Oncology | Admitting: Radiation Oncology

## 2019-06-14 DIAGNOSIS — Z510 Encounter for antineoplastic radiation therapy: Secondary | ICD-10-CM | POA: Diagnosis not present

## 2019-06-14 DIAGNOSIS — C8111 Nodular sclerosis classical Hodgkin lymphoma, lymph nodes of head, face, and neck: Secondary | ICD-10-CM | POA: Insufficient documentation

## 2019-06-15 ENCOUNTER — Other Ambulatory Visit: Payer: Self-pay | Admitting: *Deleted

## 2019-06-15 DIAGNOSIS — C8171 Other classical Hodgkin lymphoma, lymph nodes of head, face, and neck: Secondary | ICD-10-CM

## 2019-06-19 DIAGNOSIS — Z510 Encounter for antineoplastic radiation therapy: Secondary | ICD-10-CM | POA: Diagnosis not present

## 2019-06-21 ENCOUNTER — Ambulatory Visit: Admission: RE | Admit: 2019-06-21 | Payer: BC Managed Care – PPO | Source: Ambulatory Visit

## 2019-06-25 ENCOUNTER — Ambulatory Visit
Admission: RE | Admit: 2019-06-25 | Discharge: 2019-06-25 | Disposition: A | Discharge status: Home / Self Care | Payer: BC Managed Care – PPO | Source: Ambulatory Visit | Attending: Radiation Oncology | Admitting: Radiation Oncology

## 2019-06-25 DIAGNOSIS — Z510 Encounter for antineoplastic radiation therapy: Secondary | ICD-10-CM | POA: Diagnosis not present

## 2019-06-26 ENCOUNTER — Ambulatory Visit
Admission: RE | Admit: 2019-06-26 | Discharge: 2019-06-26 | Disposition: A | Discharge status: Home / Self Care | Payer: BC Managed Care – PPO | Source: Ambulatory Visit | Attending: Radiation Oncology | Admitting: Radiation Oncology

## 2019-06-26 DIAGNOSIS — Z510 Encounter for antineoplastic radiation therapy: Secondary | ICD-10-CM | POA: Diagnosis not present

## 2019-06-27 ENCOUNTER — Ambulatory Visit
Admission: RE | Admit: 2019-06-27 | Discharge: 2019-06-27 | Disposition: A | Discharge status: Home / Self Care | Payer: BC Managed Care – PPO | Source: Ambulatory Visit | Attending: Radiation Oncology | Admitting: Radiation Oncology

## 2019-06-27 DIAGNOSIS — Z510 Encounter for antineoplastic radiation therapy: Secondary | ICD-10-CM | POA: Diagnosis not present

## 2019-06-28 ENCOUNTER — Ambulatory Visit
Admission: RE | Admit: 2019-06-28 | Discharge: 2019-06-28 | Disposition: A | Discharge status: Home / Self Care | Payer: BC Managed Care – PPO | Source: Ambulatory Visit | Attending: Radiation Oncology | Admitting: Radiation Oncology

## 2019-06-28 DIAGNOSIS — Z510 Encounter for antineoplastic radiation therapy: Secondary | ICD-10-CM | POA: Diagnosis not present

## 2019-06-29 ENCOUNTER — Ambulatory Visit
Admission: RE | Admit: 2019-06-29 | Discharge: 2019-06-29 | Disposition: A | Discharge status: Home / Self Care | Payer: BC Managed Care – PPO | Source: Ambulatory Visit | Attending: Radiation Oncology | Admitting: Radiation Oncology

## 2019-06-29 DIAGNOSIS — Z510 Encounter for antineoplastic radiation therapy: Secondary | ICD-10-CM | POA: Diagnosis not present

## 2019-07-02 ENCOUNTER — Ambulatory Visit
Admission: RE | Admit: 2019-07-02 | Discharge: 2019-07-02 | Disposition: A | Discharge status: Home / Self Care | Payer: BC Managed Care – PPO | Source: Ambulatory Visit | Attending: Radiation Oncology | Admitting: Radiation Oncology

## 2019-07-02 DIAGNOSIS — C8111 Nodular sclerosis classical Hodgkin lymphoma, lymph nodes of head, face, and neck: Secondary | ICD-10-CM | POA: Insufficient documentation

## 2019-07-02 DIAGNOSIS — Z510 Encounter for antineoplastic radiation therapy: Secondary | ICD-10-CM | POA: Diagnosis not present

## 2019-07-03 ENCOUNTER — Other Ambulatory Visit: Payer: Self-pay | Admitting: *Deleted

## 2019-07-03 ENCOUNTER — Ambulatory Visit
Admission: RE | Admit: 2019-07-03 | Discharge: 2019-07-03 | Disposition: A | Discharge status: Home / Self Care | Payer: BC Managed Care – PPO | Source: Ambulatory Visit | Attending: Radiation Oncology | Admitting: Radiation Oncology

## 2019-07-03 DIAGNOSIS — Z510 Encounter for antineoplastic radiation therapy: Secondary | ICD-10-CM | POA: Diagnosis not present

## 2019-07-03 MED ORDER — SUCRALFATE 1 G PO TABS: 0.5000 g | ORAL_TABLET | Freq: Three times a day (TID) | ORAL | 0 refills | Status: AC

## 2019-07-04 ENCOUNTER — Ambulatory Visit
Admission: RE | Admit: 2019-07-04 | Discharge: 2019-07-04 | Disposition: A | Discharge status: Home / Self Care | Payer: BC Managed Care – PPO | Source: Ambulatory Visit | Attending: Radiation Oncology | Admitting: Radiation Oncology

## 2019-07-04 DIAGNOSIS — Z510 Encounter for antineoplastic radiation therapy: Secondary | ICD-10-CM | POA: Diagnosis not present

## 2019-07-05 ENCOUNTER — Ambulatory Visit: Payer: BC Managed Care – PPO

## 2019-07-05 ENCOUNTER — Inpatient Hospital Stay: Payer: BC Managed Care – PPO | Attending: Oncology

## 2019-07-05 DIAGNOSIS — Z452 Encounter for adjustment and management of vascular access device: Secondary | ICD-10-CM | POA: Insufficient documentation

## 2019-07-05 DIAGNOSIS — C8171 Other classical Hodgkin lymphoma, lymph nodes of head, face, and neck: Secondary | ICD-10-CM | POA: Insufficient documentation

## 2019-07-06 ENCOUNTER — Ambulatory Visit: Payer: BC Managed Care – PPO

## 2019-07-06 ENCOUNTER — Inpatient Hospital Stay (HOSPITAL_BASED_OUTPATIENT_CLINIC_OR_DEPARTMENT_OTHER): Payer: BC Managed Care – PPO | Admitting: Oncology

## 2019-07-06 DIAGNOSIS — C8198 Hodgkin lymphoma, unspecified, lymph nodes of multiple sites: Secondary | ICD-10-CM | POA: Diagnosis not present

## 2019-07-06 MED ORDER — LORAZEPAM 1 MG PO TABS: 1.0000 mg | ORAL_TABLET | Freq: Every evening | ORAL | 0 refills | Status: DC | PRN

## 2019-07-06 MED ORDER — MAGIC MOUTHWASH
5.0000 mL | Freq: Four times a day (QID) | ORAL | 0 refills | Status: DC | PRN
Start: 2019-07-06 — End: 2019-07-24

## 2019-07-06 MED ORDER — LIDOCAINE VISCOUS HCL 2 % MT SOLN: 15.0000 mL | OROMUCOSAL | 0 refills | Status: DC | PRN

## 2019-07-06 NOTE — Progress Notes (Signed)
Symptom Management Consult note [facility]  [phone] [phone]  Patient Care Team: Patient, No Pcp Per as PCP - General (General Practice)   Name of the patient: Dale Hansen  LE:[id number]  17-Jun-1995   Date of visit: 07/06/2019   Diagnosis- Stage IIa hodgkin lymphoma  Chief complaint/ Reason for visit- throat pain  I connected with Dale Hansen on 07/06/19 at  2:15 PM EDT by video enabled telemedicine visit and verified that I am speaking with the correct person using two identifiers.   I discussed the limitations, risks, security and privacy concerns of performing an evaluation and management service by telemedicine and the availability of in-person appointments. I also discussed with the patient that there may be a patient responsible charge related to this service. The patient expressed understanding and agreed to proceed.   Other persons participating in the visit and their role in the encounter: None  Patient's location: Home Provider's location: office  Heme/Onc history:  Oncology History  Hodgkin lymphoma of lymph nodes of neck ([place])  01/13/2019 Initial Diagnosis   Hodgkin lymphoma ([place])   01/19/2019 Cancer Staging   Staging form: Hodgkin and Non-Hodgkin Lymphoma, AJCC 8th Edition - Clinical stage from 01/19/2019: Stage II (Hodgkin lymphoma, A - Asymptomatic) - Signed by Lloyd Huger, MD on 01/19/2019   03/21/2019 -  Chemotherapy   The patient had DOXOrubicin (ADRIAMYCIN) chemo injection 50 mg, 22.8 mg/m2 = 54 mg, Intravenous,  Once, 3 of 3 cycles Administration: 50 mg (03/21/2019), 50 mg (04/06/2019), 50 mg (04/30/2019), 50 mg (05/14/2019) palonosetron (ALOXI) injection 0.25 mg, 0.25 mg, Intravenous,  Once, 3 of 3 cycles Administration: 0.25 mg (03/21/2019), 0.25 mg (04/06/2019), 0.25 mg (04/30/2019), 0.25 mg (05/14/2019), 0.25 mg (05/28/2019) pegfilgrastim-cbqv (UDENYCA) injection 6 mg, 6 mg, Subcutaneous, Once, 2 of 2  cycles Administration: 6 mg (05/02/2019), 6 mg (05/16/2019), 6 mg (05/30/2019) bleomycin (BLEOCIN) 22 Units in sodium chloride 0.9 % 50 mL chemo infusion, 10 Units/m2 = 22 Units, Intravenous,  Once, 3 of 3 cycles Administration: 22 Units (03/21/2019), 22 Units (04/06/2019), 22 Units (04/30/2019), 22 Units (05/14/2019), 22 Units (05/28/2019) dacarbazine (DTIC) 800 mg in sodium chloride 0.9 % 250 mL chemo infusion, 365 mg/m2 = 820 mg, Intravenous,  Once, 3 of 3 cycles Administration: 800 mg (03/21/2019), 800 mg (04/06/2019), 800 mg (04/30/2019), 800 mg (05/14/2019), 800 mg (05/28/2019) fosaprepitant (EMEND) 150 mg in sodium chloride 0.9 % 145 mL IVPB, 150 mg, Intravenous,  Once, 3 of 3 cycles Administration: 150 mg (03/21/2019), 150 mg (04/06/2019), 150 mg (04/30/2019), 150 mg (05/14/2019), 150 mg (05/28/2019) vinBLAStine (VELBAN) 13.1 mg in sodium chloride 0.9 % 50 mL chemo infusion, 6 mg/m2 = 13.1 mg, Intravenous, Once, 3 of 3 cycles Administration: 13.1 mg (03/21/2019), 13.1 mg (04/06/2019), 13.1 mg (04/30/2019), 13.1 mg (05/14/2019), 13.1 mg (05/28/2019)  for chemotherapy treatment.     Interval history-Dale Hansen is a 24 year old male with recent diagnosis of stage IIa Hodgkin's lymphoma and pmh significant for ADD and seasonal allergies. He is in [employer] here from [place] who presented with painless swelling to his left neck.  Had PET CT initially showing hypermetabolism 6.2 cm mass in left neck.  Had bilateral Jearld Lesch 5 adenopathy in the neck with hypermetabolic activity and both palatine tonsils.  CT-guided core biopsy of lymph node showed classic Hodgkin's lymphoma EBV positive.  Bone marrow was normal.  He has completed 4 cycles of ABVD chemotherapy which he tolerated well.  Most recent PET/CT showed almost complete resolution  of all hypermetabolic adenopathy in the head neck region.  Dale Hansen started radiation on 06/25/2019 and has completed a total of 8 of 15 treatments.  Patient states that on Tuesday evening he developed  a sore throat and problems swallowing.  He received radiation on Wednesday but took Thursday and Friday off given mouth and throat pain.  He has been using Carafate as prescribed by Dr. Donella Stade.  He is having trouble swallowing medications and food.  He is able to slowly drink liquids.  He denies a fever or illness, weight loss, chest pain, shortness of breath, nausea, vomiting, constipation or diarrhea.  He does state the pain is causing him to stay awake at night.  ECOG FS:1 - Symptomatic but completely ambulatory  Review of systems- Review of Systems  Constitutional: Positive for malaise/fatigue. Negative for chills, fever and weight loss.  HENT: Positive for sore throat. Negative for congestion, ear pain and tinnitus.        Mouth pain  Eyes: Negative.  Negative for blurred vision and double vision.  Respiratory: Negative.  Negative for cough, sputum production and shortness of breath.   Cardiovascular: Negative.  Negative for chest pain, palpitations and leg swelling.  Gastrointestinal: Negative.  Negative for abdominal pain, constipation, diarrhea, nausea and vomiting.  Genitourinary: Negative for dysuria, frequency and urgency.  Musculoskeletal: Negative for back pain and falls.  Skin: Negative.  Negative for rash.  Neurological: Negative.  Negative for weakness and headaches.  Endo/Heme/Allergies: Negative.  Does not bruise/bleed easily.  Psychiatric/Behavioral: Negative for depression. The patient is nervous/anxious and has insomnia.      Current treatment- s/p 4 cycles of ABVD and currently receiving radiation to the neck (8/15 treatments)  No Known Allergies   Past Medical History:  Diagnosis Date  . Attention deficit disorder (ADD)   . Seasonal allergies      Past Surgical History:  Procedure Laterality Date  . SKIN GRAFT Right    right middle finger    Social History   Socioeconomic History  . Marital status: Single    Spouse name: Not on file  . Number of  children: Not on file  . Years of education: Not on file  . Highest education level: Not on file  Occupational History  . Not on file  Tobacco Use  . Smoking status: Current Every Day Smoker    Packs/day: 0.25  . Smokeless tobacco: Never Used  Substance and Sexual Activity  . Alcohol use: Yes    Comment: socially  . Drug use: Never  . Sexual activity: Not on file  Other Topics Concern  . Not on file  Social History Narrative  . Not on file   Social Determinants of Health   Financial Resource Strain:   . Difficulty of Paying Living Expenses:   Food Insecurity:   . Worried About Charity fundraiser in the Last Year:   . Arboriculturist in the Last Year:   Transportation Needs:   . Film/video editor (Medical):   Marland Kitchen Lack of Transportation (Non-Medical):   Physical Activity:   . Days of Exercise per Week:   . Minutes of Exercise per Session:   Stress:   . Feeling of Stress :   Social Connections:   . Frequency of Communication with Friends and Family:   . Frequency of Social Gatherings with Friends and Family:   . Attends Religious Services:   . Active Member of Clubs or Organizations:   . Attends Club  or Organization Meetings:   Marland Kitchen Marital Status:   Intimate Partner Violence:   . Fear of Current or Ex-Partner:   . Emotionally Abused:   Marland Kitchen Physically Abused:   . Sexually Abused:     Family History  Problem Relation Age of Onset  . Cervical cancer Maternal Grandmother   . Non-Hodgkin's lymphoma Paternal Uncle      Current Outpatient Medications:  .  amphetamine-dextroamphetamine (ADDERALL) 20 MG tablet, Take by mouth., Disp: , Rfl:  .  lidocaine (XYLOCAINE) 2 % solution, Use as directed 15 mLs in the mouth or throat as needed for mouth pain., Disp: 100 mL, Rfl: 0 .  lidocaine-prilocaine (EMLA) cream, , Disp: , Rfl:  .  LORazepam (ATIVAN) 1 MG tablet, Take 1 tablet (1 mg total) by mouth at bedtime as needed for anxiety., Disp: 30 tablet, Rfl: 0 .  magic  mouthwash SOLN, Take 5 mLs by mouth 4 (four) times daily as needed for mouth pain., Disp: 240 mL, Rfl: 0 .  ondansetron (ZOFRAN) 8 MG tablet, Take 1 tablet (8 mg total) by mouth every 8 (eight) hours as needed for nausea or vomiting., Disp: 60 tablet, Rfl: 2 .  prochlorperazine (COMPAZINE) 10 MG tablet, Take by mouth., Disp: , Rfl:  .  sucralfate (CARAFATE) 1 g tablet, Take 0.5 tablets (0.5 g total) by mouth 3 (three) times daily., Disp: 60 tablet, Rfl: 0 .  VYVANSE 60 MG capsule, TK 1 C PO QD, Disp: , Rfl:   Physical exam: There were no vitals filed for this visit. Physical Exam Constitutional:      Appearance: Normal appearance.  HENT:     Mouth/Throat:     Tongue: No lesions.  Pulmonary:     Effort: Pulmonary effort is normal.  Musculoskeletal:        General: Normal range of motion.  Skin:    Findings: No rash.  Neurological:     Mental Status: He is alert and oriented to person, place, and time.  Psychiatric:        Judgment: Judgment normal.      CMP Latest Ref Rng & Units 05/28/2019  Glucose 70 - 99 mg/dL 147(H)  BUN 6 - 20 mg/dL 13  Creatinine 0.61 - 1.24 mg/dL 0.90  Sodium 135 - 145 mmol/L 133(L)  Potassium 3.5 - 5.1 mmol/L 3.4(L)  Chloride 98 - 111 mmol/L 100  CO2 22 - 32 mmol/L 25  Calcium 8.9 - 10.3 mg/dL 8.9  Total Protein 6.5 - 8.1 g/dL 6.9  Total Bilirubin 0.3 - 1.2 mg/dL 0.5  Alkaline Phos 38 - 126 U/L 101  AST 15 - 41 U/L 49(H)  ALT 0 - 44 U/L 34   CBC Latest Ref Rng & Units 05/28/2019  WBC 4.0 - 10.5 K/uL 10.7(H)  Hemoglobin 13.0 - 17.0 g/dL 14.5  Hematocrit 39.0 - 52.0 % 40.9  Platelets 150 - 400 K/uL 329    No images are attached to the encounter.  No results found.   Assessment and plan- Patient is a 24 y.o. male who presents for virtual symptom management appointment today to discuss worsening sore throat and mouth pain secondary to radiation.  I spoke with Mr. Combest via [place] video visit.  He does not appear to be in any distress.  He is  nontoxic appearing.  He is afebrile and is breathing normal.  He is currently taking Carafate as prescribed by Dr. Donella Stade.  Given his symptoms, I would recommend Magic mouthwash with viscous lidocaine along with  purchasing healios to help repair tissues in his esophagus.  According to up-to-date management of acute esophagitis is symptom control.  Often interruption of radiation therapy is recommended if symptoms are severe.  Topical anesthetics such as viscous lidocaine, analgesics such as anti-inflammatory or narcotics and antisecretory therapy such as PPI or H2 blockers are recommended.  Dietary recommendations include a bland pured or soft food diet to help maintain adequate caloric and liquid intake.  Eating more frequent, smaller meals and avoiding foods that are very hot or very cold may be useful.  Avoidance of alcohol, smoking or coffee may be helpful.  Patient is to call clinic if symptoms worsen and speak to the on-call physician.  Plan: Rx Magic mouthwash. Rx viscous lidocaine. Suggested purchasing Healios from [place].  RX ativan 1 mg at bedtime to help with anxiety and sleep.   Disposition:   RTC as scheduled on Monday for radiation treatment.    I provided 15 minutes of face-to-face video visit time during this encounter, and > 50% was spent counseling as documented under my assessment & plan.   Visit Diagnosis 1. Hodgkin lymphoma of lymph nodes of multiple regions, unspecified Hodgkin lymphoma type [facility])     Patient expressed understanding and was in agreement with this plan. He also understands that He can call clinic at any time with any questions, concerns, or complaints.   Thank you for allowing me to participate in the care of this very pleasant patient.    Jacquelin Hawking, NP [place] [facility] Cell - [phone] Pager- [phone] 07/06/2019 3:04 PM

## 2019-07-08 ENCOUNTER — Encounter: Payer: Self-pay | Admitting: Oncology

## 2019-07-08 ENCOUNTER — Other Ambulatory Visit: Payer: Self-pay | Admitting: Oncology

## 2019-07-08 MED ORDER — MORPHINE SULFATE (CONCENTRATE) 20 MG/ML PO SOLN: 10.0000 mg | ORAL | 0 refills | Status: DC | PRN

## 2019-07-09 ENCOUNTER — Ambulatory Visit: Payer: BC Managed Care – PPO

## 2019-07-09 ENCOUNTER — Ambulatory Visit: Admission: RE | Admit: 2019-07-09 | Payer: BC Managed Care – PPO | Source: Ambulatory Visit

## 2019-07-09 ENCOUNTER — Other Ambulatory Visit: Payer: Self-pay | Admitting: Emergency Medicine

## 2019-07-09 ENCOUNTER — Other Ambulatory Visit: Payer: Self-pay | Admitting: *Deleted

## 2019-07-09 ENCOUNTER — Telehealth: Payer: Self-pay | Admitting: *Deleted

## 2019-07-09 DIAGNOSIS — C8198 Hodgkin lymphoma, unspecified, lymph nodes of multiple sites: Secondary | ICD-10-CM

## 2019-07-09 MED ORDER — MORPHINE SULFATE (CONCENTRATE) 20 MG/ML PO SOLN: 10.0000 mg | ORAL | 0 refills | Status: DC | PRN

## 2019-07-09 MED ORDER — DEXAMETHASONE 2 MG PO TABS
2.0000 mg | ORAL_TABLET | Freq: Two times a day (BID) | ORAL | 0 refills | Status: AC
Start: 2019-07-09 — End: 2019-08-08

## 2019-07-09 NOTE — Telephone Encounter (Signed)
[facility] - I sent you a refill request with the correct bottle size. You just need to sign it.

## 2019-07-09 NOTE — Telephone Encounter (Signed)
Pharmacy called requesting for Roxanol prescription sent yesterday be changed to 30 ml dispense due to it coming in 30 ml bottle, not 15 ml. Please resubmit prescription for 30 ml bottle

## 2019-07-09 NOTE — Telephone Encounter (Signed)
That's fine

## 2019-07-10 ENCOUNTER — Ambulatory Visit: Payer: BC Managed Care – PPO

## 2019-07-11 ENCOUNTER — Ambulatory Visit
Admission: RE | Admit: 2019-07-11 | Discharge: 2019-07-11 | Disposition: A | Discharge status: Home / Self Care | Payer: BC Managed Care – PPO | Source: Ambulatory Visit | Attending: Radiation Oncology | Admitting: Radiation Oncology

## 2019-07-11 DIAGNOSIS — Z510 Encounter for antineoplastic radiation therapy: Secondary | ICD-10-CM | POA: Diagnosis not present

## 2019-07-12 ENCOUNTER — Ambulatory Visit
Admission: RE | Admit: 2019-07-12 | Discharge: 2019-07-12 | Disposition: A | Discharge status: Home / Self Care | Payer: BC Managed Care – PPO | Source: Ambulatory Visit | Attending: Radiation Oncology | Admitting: Radiation Oncology

## 2019-07-12 DIAGNOSIS — Z510 Encounter for antineoplastic radiation therapy: Secondary | ICD-10-CM | POA: Diagnosis not present

## 2019-07-13 ENCOUNTER — Ambulatory Visit: Payer: BC Managed Care – PPO

## 2019-07-13 ENCOUNTER — Ambulatory Visit
Admission: RE | Admit: 2019-07-13 | Discharge: 2019-07-13 | Disposition: A | Discharge status: Home / Self Care | Payer: BC Managed Care – PPO | Source: Ambulatory Visit | Attending: Radiation Oncology | Admitting: Radiation Oncology

## 2019-07-13 DIAGNOSIS — Z510 Encounter for antineoplastic radiation therapy: Secondary | ICD-10-CM | POA: Diagnosis not present

## 2019-07-16 ENCOUNTER — Ambulatory Visit: Payer: BC Managed Care – PPO

## 2019-07-16 ENCOUNTER — Ambulatory Visit
Admission: RE | Admit: 2019-07-16 | Discharge: 2019-07-16 | Disposition: A | Discharge status: Home / Self Care | Payer: BC Managed Care – PPO | Source: Ambulatory Visit | Attending: Radiation Oncology | Admitting: Radiation Oncology

## 2019-07-16 DIAGNOSIS — Z510 Encounter for antineoplastic radiation therapy: Secondary | ICD-10-CM | POA: Diagnosis not present

## 2019-07-17 ENCOUNTER — Ambulatory Visit
Admission: RE | Admit: 2019-07-17 | Discharge: 2019-07-17 | Disposition: A | Discharge status: Home / Self Care | Payer: BC Managed Care – PPO | Source: Ambulatory Visit | Attending: Radiation Oncology | Admitting: Radiation Oncology

## 2019-07-17 ENCOUNTER — Other Ambulatory Visit: Payer: Self-pay | Admitting: Emergency Medicine

## 2019-07-17 ENCOUNTER — Other Ambulatory Visit: Payer: Self-pay | Admitting: *Deleted

## 2019-07-17 ENCOUNTER — Ambulatory Visit: Payer: BC Managed Care – PPO

## 2019-07-17 DIAGNOSIS — Z510 Encounter for antineoplastic radiation therapy: Secondary | ICD-10-CM | POA: Diagnosis not present

## 2019-07-17 DIAGNOSIS — C8198 Hodgkin lymphoma, unspecified, lymph nodes of multiple sites: Secondary | ICD-10-CM

## 2019-07-17 MED ORDER — MORPHINE SULFATE (CONCENTRATE) 20 MG/ML PO SOLN: 10.0000 mg | ORAL | 0 refills | Status: AC | PRN

## 2019-07-18 ENCOUNTER — Ambulatory Visit: Payer: BC Managed Care – PPO

## 2019-07-18 ENCOUNTER — Ambulatory Visit
Admission: RE | Admit: 2019-07-18 | Discharge: 2019-07-18 | Disposition: A | Discharge status: Home / Self Care | Payer: BC Managed Care – PPO | Source: Ambulatory Visit | Attending: Radiation Oncology | Admitting: Radiation Oncology

## 2019-07-18 DIAGNOSIS — Z510 Encounter for antineoplastic radiation therapy: Secondary | ICD-10-CM | POA: Diagnosis not present

## 2019-07-19 ENCOUNTER — Ambulatory Visit
Admission: RE | Admit: 2019-07-19 | Discharge: 2019-07-19 | Disposition: A | Discharge status: Home / Self Care | Payer: BC Managed Care – PPO | Source: Ambulatory Visit | Attending: Radiation Oncology | Admitting: Radiation Oncology

## 2019-07-19 DIAGNOSIS — Z510 Encounter for antineoplastic radiation therapy: Secondary | ICD-10-CM | POA: Diagnosis not present

## 2019-07-23 ENCOUNTER — Inpatient Hospital Stay: Payer: BC Managed Care – PPO

## 2019-07-24 ENCOUNTER — Other Ambulatory Visit: Payer: Self-pay | Admitting: Oncology

## 2019-07-24 ENCOUNTER — Encounter: Payer: Self-pay | Admitting: Oncology

## 2019-07-24 DIAGNOSIS — C8198 Hodgkin lymphoma, unspecified, lymph nodes of multiple sites: Secondary | ICD-10-CM

## 2019-07-24 DIAGNOSIS — K1379 Other lesions of oral mucosa: Secondary | ICD-10-CM

## 2019-07-24 MED ORDER — LIDOCAINE VISCOUS HCL 2 % MT SOLN
15.0000 mL | OROMUCOSAL | 0 refills | Status: AC | PRN
Start: 2019-07-24 — End: ?

## 2019-07-24 MED ORDER — LORAZEPAM 1 MG PO TABS
1.0000 mg | ORAL_TABLET | Freq: Every evening | ORAL | 0 refills | Status: AC | PRN
Start: 2019-07-24 — End: ?

## 2019-07-24 MED ORDER — MAGIC MOUTHWASH: 5.0000 mL | Freq: Four times a day (QID) | ORAL | 0 refills | Status: AC | PRN

## 2019-07-25 ENCOUNTER — Other Ambulatory Visit: Payer: Self-pay

## 2019-07-25 ENCOUNTER — Inpatient Hospital Stay: Payer: BC Managed Care – PPO

## 2019-07-25 DIAGNOSIS — Z95828 Presence of other vascular implants and grafts: Secondary | ICD-10-CM

## 2019-07-25 DIAGNOSIS — C8171 Other classical Hodgkin lymphoma, lymph nodes of head, face, and neck: Secondary | ICD-10-CM | POA: Diagnosis present

## 2019-07-25 DIAGNOSIS — Z452 Encounter for adjustment and management of vascular access device: Secondary | ICD-10-CM | POA: Diagnosis present

## 2019-07-25 MED ORDER — HEPARIN SOD (PORK) LOCK FLUSH 100 UNIT/ML IV SOLN
INTRAVENOUS | Status: AC
  Filled 2019-07-25: qty 5

## 2019-07-25 MED ORDER — HEPARIN SOD (PORK) LOCK FLUSH 100 UNIT/ML IV SOLN
500.0000 [IU] | Freq: Once | INTRAVENOUS | Status: AC
  Administered 2019-07-25: 500 [IU] via INTRAVENOUS
  Filled 2019-07-25: qty 5

## 2019-07-25 MED ORDER — SODIUM CHLORIDE 0.9% FLUSH
10.0000 mL | Freq: Once | INTRAVENOUS | Status: AC
  Administered 2019-07-25: 10 mL via INTRAVENOUS
  Filled 2019-07-25: qty 10

## 2019-07-27 ENCOUNTER — Telehealth: Payer: Self-pay | Admitting: *Deleted

## 2019-07-27 NOTE — Telephone Encounter (Signed)
Dr Lanny Cramp, dentist called wanting to discuss treatment plan. Please return call [phone]

## 2019-07-27 NOTE — Telephone Encounter (Signed)
What treatment plan?  Patient is moving back to [place]. Not further treatments are schedule.

## 2019-08-01 ENCOUNTER — Telehealth: Payer: Self-pay | Admitting: *Deleted

## 2019-08-01 ENCOUNTER — Encounter: Payer: Self-pay | Admitting: *Deleted

## 2019-08-01 NOTE — Telephone Encounter (Signed)
Dental office called, letter faxed giving clearance for dental cleaning and xrays.

## 2019-08-01 NOTE — Telephone Encounter (Signed)
Dr Gardiner Barefoot office called asking if patient is ok to have dental procedure. Please return their call [phone]

## 2020-03-19 IMAGING — US US BIOPSY LYMPH NODE
1 series · 14 of 25 positions shown · non-contrast
Comparison: none

INDICATION: Left neck mass.

[Series 1: us biopsy lymph node · 0.08mm/px · 14 of 32 slices shown]
[im 1/32]
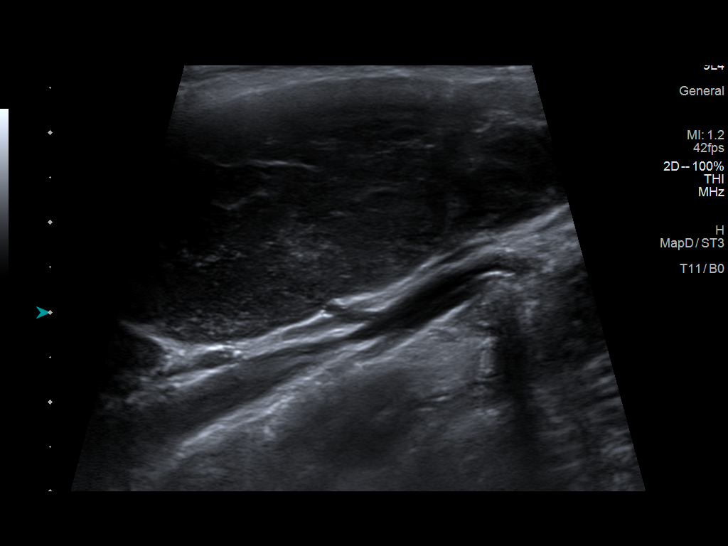
[im 3/32]
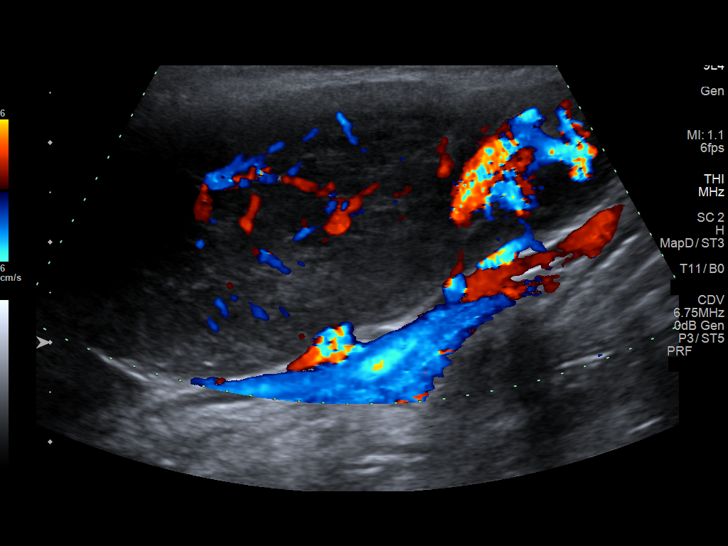
[im 6/32]
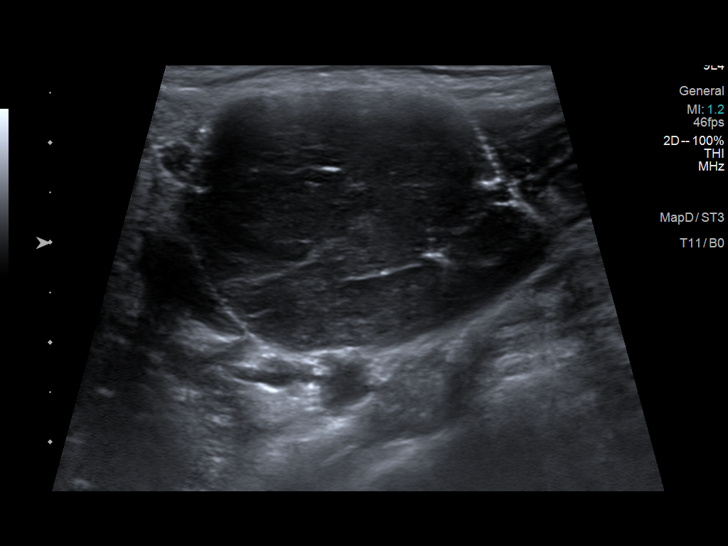
[im 8/32]
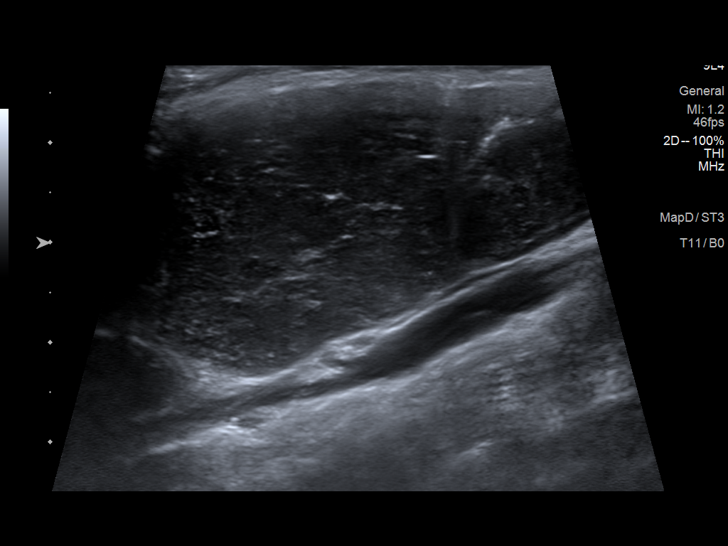
[im 11/32]
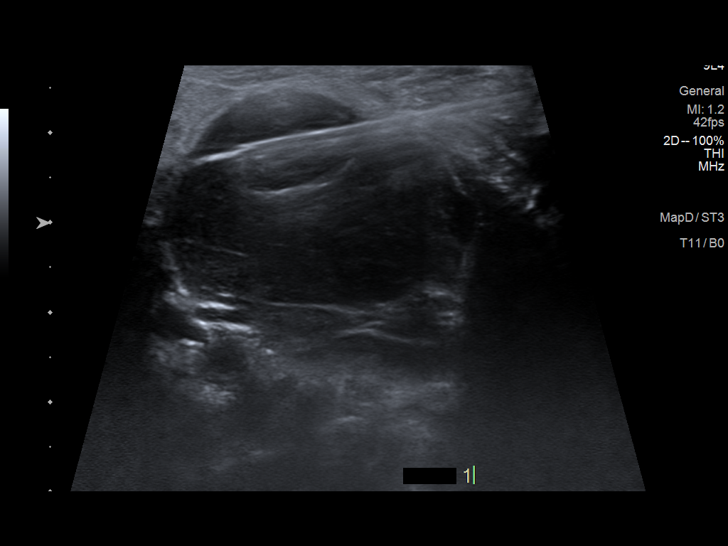
[im 12/32]
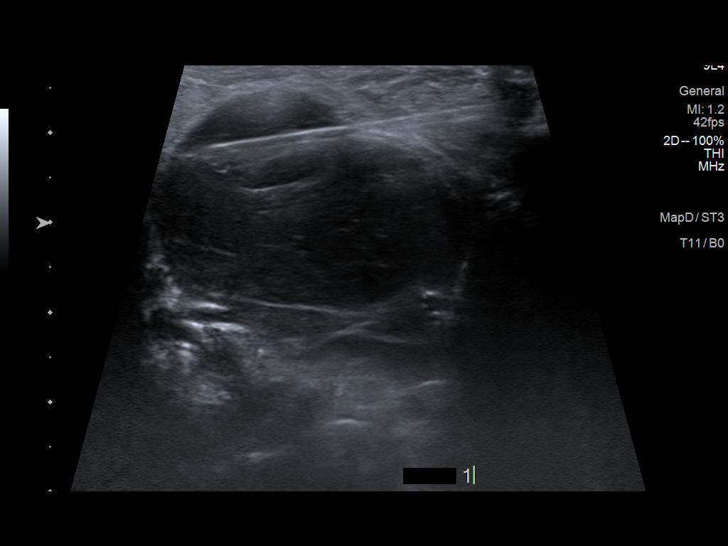
[im 15/32]
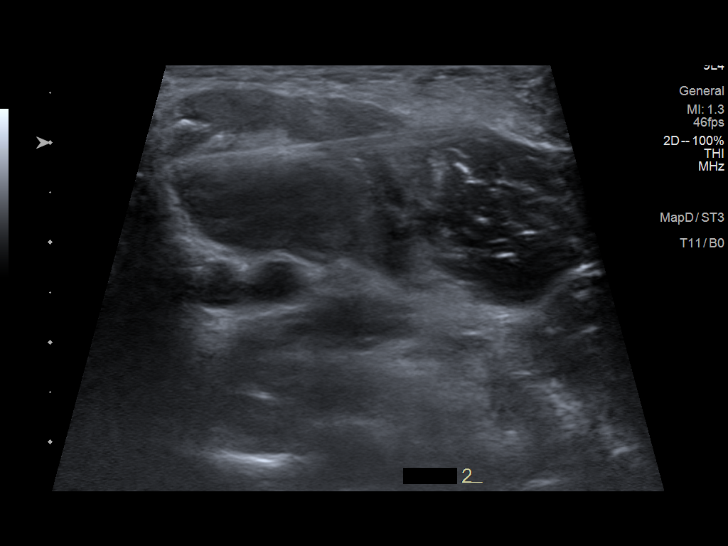
[im 17/32]
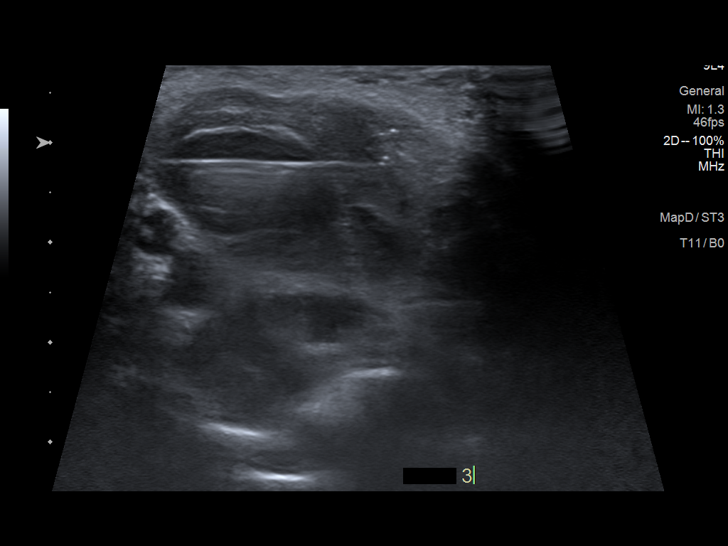
[im 20/32]
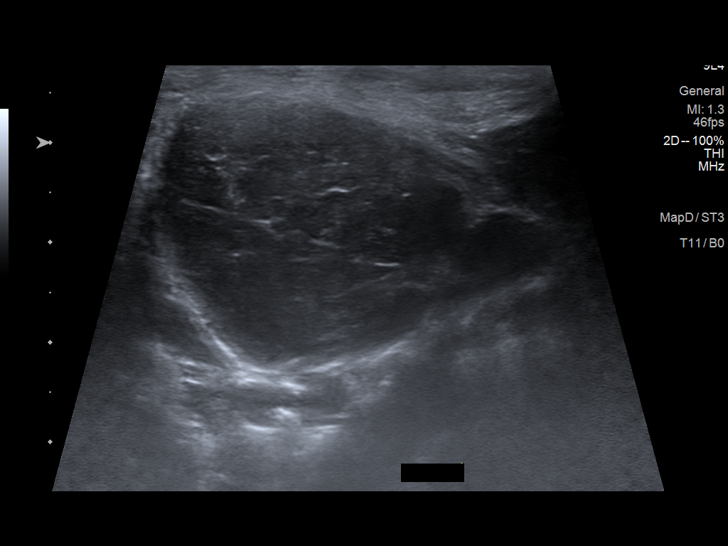
[im 21/32]
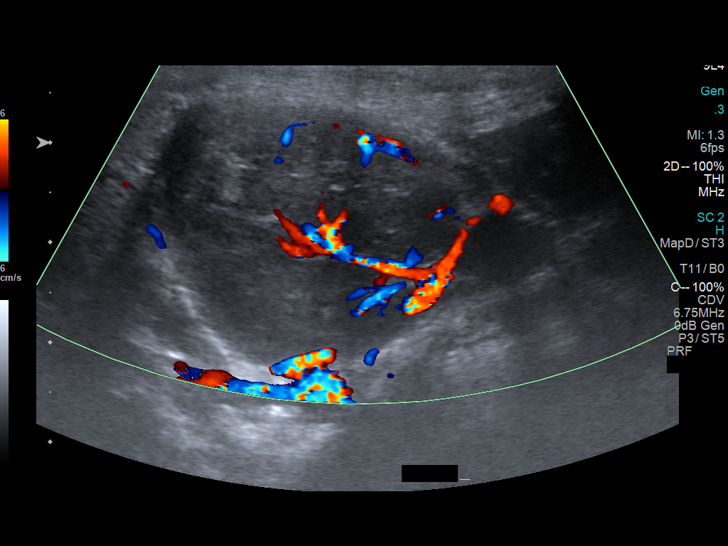
[im 24/32]
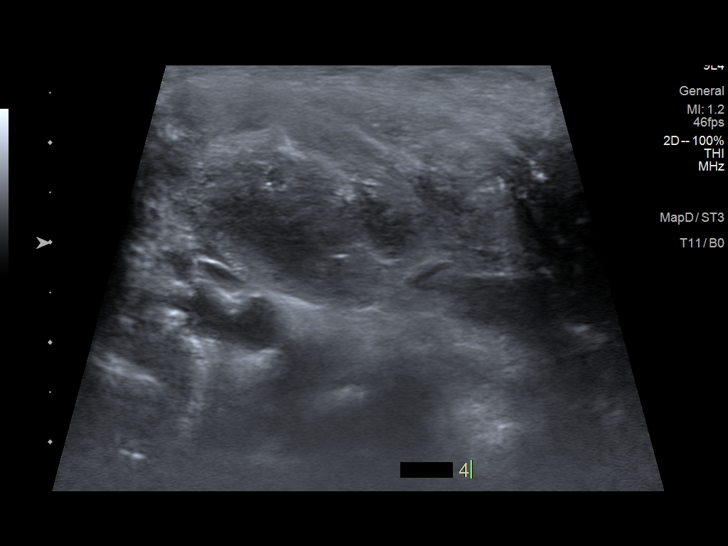
[im 26/32]
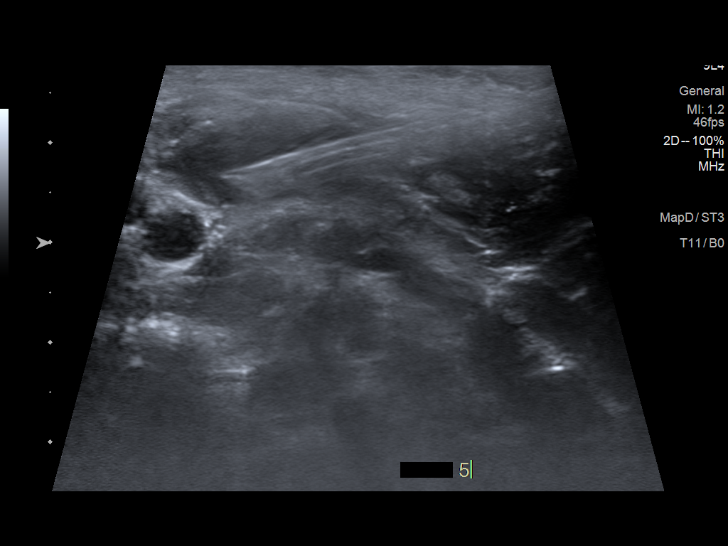
[im 29/32]
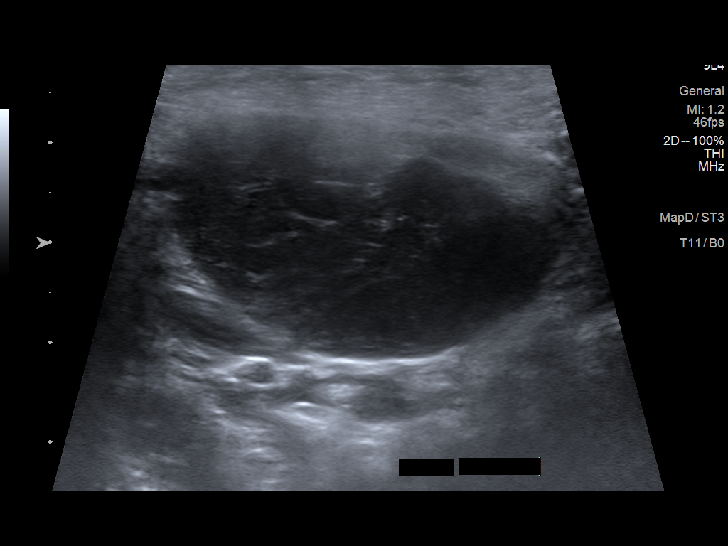
[im 32/32]
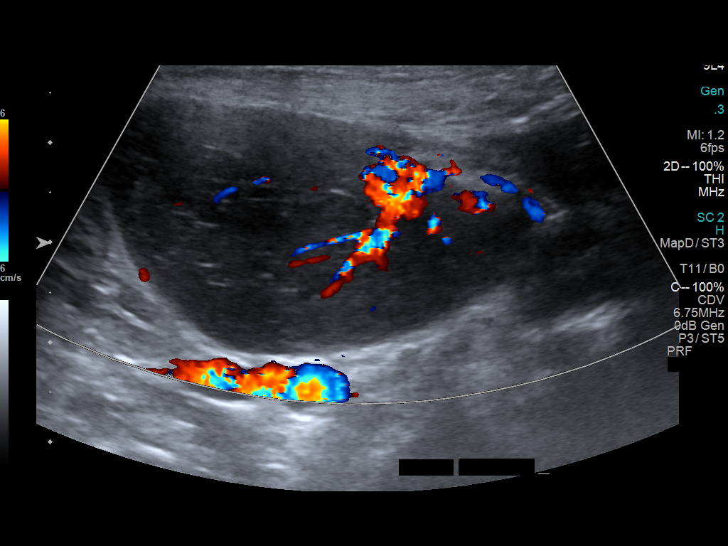

[14 of 25 positions shown; findings below may reference images not displayed]

EXAM:
ULTRASOUND GUIDED CORE BIOPSY OF LEFT NECK MASS/LYMPH NODE/

MEDICATIONS:
None.

ANESTHESIA/SEDATION:
Local 1% lidocaine.

PROCEDURE:
The procedure, risks, benefits, and alternatives were explained to
the patient. Questions regarding the procedure were encouraged and
answered. The patient understands and consents to the procedure.

The left neck was prepped with chlorhexidine in a sterile fashion,
and a sterile drape was applied covering the operative field. A
sterile gown and sterile gloves were used for the procedure. Local
anesthesia was provided with 1% Lidocaine.

Multiple 18 gauge core samples obtained from the dominant left neck
mass. There were no complication. Preliminary cytopathological
results indicate good biopsy sample.

COMPLICATIONS:
None.
FINDINGS: Successful ultrasound-directed core biopsy of left neck mass/lymph
node. No complications. Discharge instructions given to the patient.
Follow-up with patient's physician.
IMPRESSION: Successful ultrasound-directed core biopsy of left neck mass/lymph
node.

## 2020-09-04 IMAGING — PT NM PET TUM IMG RESTAG (PS) SKULL BASE T - THIGH
1 of 10 series · 1 of 25 positions shown · non-contrast
Comparison: 04/04/2019

CLINICAL DATA: Subsequent treatment strategy for Hodgkin's
lymphoma.

EXAM:
NUCLEAR MEDICINE PET SKULL BASE TO THIGH
TECHNIQUE: 11.1 mCi F-18 FDG was injected intravenously. Full-ring PET imaging
was performed from the skull base to thigh after the radiotracer. CT
data was obtained and used for attenuation correction and anatomic
localization.
Fasting blood glucose: 84 mg/dl

[Series 3: ct wb 5.0 b30f · axial · 5.0mm · 0.98mm/px · 1 of 368 slices shown]
[im 368/368  brain]
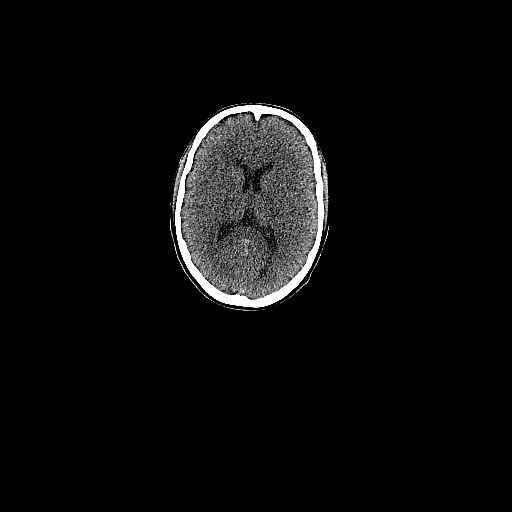

[1 of 25 positions shown; findings below may reference images not displayed]

FINDINGS: Mediastinal blood pool activity: SUV max

Liver activity: SUV max

NECK: Right-sided level 2 node measures 9 mm and a S.U.V. max of
on 44/3. Compare 11 mm and a S.U.V. max of 3.8 on the prior exam.

A low left jugular/supraclavicular node measures 5 mm and a S.U.V.
max of 1.8 today versus 6 mm and a S.U.V. max of 1.9 on the prior.

No new hypermetabolic nodes within the neck.

Incidental CT findings: The previously described left-sided level 2
node is no longer hypermetabolic and measures 8 mm on 50/3. Compare
11 mm on the prior.

CHEST: No pulmonary parenchymal or thoracic nodal hypermetabolism.

Incidental CT findings: Right Port-A-Cath tip at high to mid right
atrium. No thoracic adenopathy.

ABDOMEN/PELVIS: No abdominopelvic parenchymal or nodal
hypermetabolism.

Incidental CT findings: Normal adrenal glands. Probable hepatic
steatosis. No abdominopelvic adenopathy.

SKELETON: Diffuse marrow hypermetabolism is likely due to
stimulation by chemotherapy.

Incidental CT findings: No focal osseous lesion.
IMPRESSION: 1. Response to therapy of cervical nodal disease.  ([HOSPITAL]) 3
2. No new sites of disease identified.
3. Diffuse marrow hypermetabolism, likely due to stimulation by
chemotherapy.
